# Patient Record
Sex: Female | Born: 1975 | Race: White | Hispanic: No | Marital: Single | Smoking: Current every day smoker
Health system: Southern US, Community
[De-identification: ages and names within clinical notes are randomized; demographics above are authoritative.]

## PROBLEM LIST (undated history)

## (undated) DIAGNOSIS — D649 Anemia, unspecified: Secondary | ICD-10-CM

## (undated) DIAGNOSIS — M1990 Unspecified osteoarthritis, unspecified site: Secondary | ICD-10-CM

## (undated) DIAGNOSIS — J45909 Unspecified asthma, uncomplicated: Secondary | ICD-10-CM

## (undated) DIAGNOSIS — F419 Anxiety disorder, unspecified: Secondary | ICD-10-CM

## (undated) DIAGNOSIS — Z9889 Other specified postprocedural states: Secondary | ICD-10-CM

## (undated) DIAGNOSIS — T7840XA Allergy, unspecified, initial encounter: Secondary | ICD-10-CM

## (undated) DIAGNOSIS — J189 Pneumonia, unspecified organism: Secondary | ICD-10-CM

## (undated) DIAGNOSIS — K219 Gastro-esophageal reflux disease without esophagitis: Secondary | ICD-10-CM

## (undated) DIAGNOSIS — R112 Nausea with vomiting, unspecified: Secondary | ICD-10-CM

## (undated) HISTORY — DX: Unspecified asthma, uncomplicated: J45.909

## (undated) HISTORY — DX: Allergy, unspecified, initial encounter: T78.40XA

## (undated) HISTORY — PX: FINGER SURGERY: SHX640

## (undated) HISTORY — DX: Unspecified osteoarthritis, unspecified site: M19.90

## (undated) HISTORY — DX: Anxiety disorder, unspecified: F41.9

## (undated) HISTORY — PX: WISDOM TOOTH EXTRACTION: SHX21

## (undated) HISTORY — PX: BREAST SURGERY: SHX581

---

## 1995-11-25 DIAGNOSIS — J189 Pneumonia, unspecified organism: Secondary | ICD-10-CM

## 1995-11-25 HISTORY — DX: Pneumonia, unspecified organism: J18.9

## 2000-05-19 ENCOUNTER — Emergency Department (HOSPITAL_COMMUNITY): Admission: EM | Admit: 2000-05-19 | Discharge: 2000-05-19 | Payer: Self-pay

## 2011-11-20 ENCOUNTER — Ambulatory Visit (INDEPENDENT_AMBULATORY_CARE_PROVIDER_SITE_OTHER): Payer: PRIVATE HEALTH INSURANCE

## 2011-11-20 DIAGNOSIS — R05 Cough: Secondary | ICD-10-CM

## 2011-11-20 DIAGNOSIS — F17200 Nicotine dependence, unspecified, uncomplicated: Secondary | ICD-10-CM

## 2012-04-27 ENCOUNTER — Telehealth: Payer: Self-pay

## 2012-04-27 NOTE — Telephone Encounter (Signed)
Please pull paper chart and forward phone not to primary provider.

## 2012-04-27 NOTE — Telephone Encounter (Signed)
Chart is your box per ryan's request.

## 2012-04-27 NOTE — Telephone Encounter (Signed)
Pt is calling to see if we could possible get her a letter to say that the e-cigrette is necessary for her if she gets that she says that her hsa account will pay it only with that letter. She tried taking the chantix we prescribed her but she said it made her feel psycotic please call her and let her know if we can do this

## 2012-05-03 ENCOUNTER — Telehealth: Payer: Self-pay

## 2012-05-03 NOTE — Telephone Encounter (Signed)
Needs letter for  Insurance purposes to support getting e - cigarettes.  Best [phone])

## 2012-05-05 NOTE — Telephone Encounter (Signed)
Explained to pt the need for OV for documentation. Pt verbalized understanding, but said she would just pay OOP for the e -cigs instead of having to pay for the OV.

## 2012-05-05 NOTE — Telephone Encounter (Signed)
Needs office visit so we can document what all she has tried, side effects, etc.  This info will be needed to document the need for the e-cigs

## 2012-07-06 ENCOUNTER — Telehealth: Payer: Self-pay

## 2012-07-06 NOTE — Telephone Encounter (Signed)
Pt would like to have her entire medical record copied. Please call when ready for pick up. Best# [phone]

## 2012-07-07 NOTE — Telephone Encounter (Signed)
Records copied and ready for pickup. Patient notified.

## 2012-07-17 ENCOUNTER — Telehealth: Payer: Self-pay

## 2012-07-17 NOTE — Telephone Encounter (Signed)
Pt is requesting we look in her chart and let her know what medications she is allergic to,.  Chart [id number]  cbn [phone]

## 2012-07-19 NOTE — Telephone Encounter (Signed)
LMOM advising pt of the two medications that we have listed in her chart as allergies and included both names of each. Asked for CB if pt has further ?s

## 2012-07-19 NOTE — Telephone Encounter (Signed)
Zyban (bupropion) and Lamisil (terbinafine)

## 2012-07-24 NOTE — Telephone Encounter (Signed)
Paper chart pulled again.  Last ov in December 2012.  At that ov - she had stated that e-cigarette caused sore throat.  Called patient - left message on voice mail that we can write a letter if still needed for e-cigarette, if she can tolerate this,  as was not able to tolerate chantix.

## 2012-07-25 NOTE — Telephone Encounter (Signed)
This was already discussed via separate phone encounter, and patient was advised to RTC to discuss this.  Do we still need to call?

## 2012-07-26 NOTE — Telephone Encounter (Signed)
If patient has already been advised once to RTC for this then we do not need to call again.

## 2013-04-05 ENCOUNTER — Telehealth: Payer: Self-pay | Admitting: Radiology

## 2013-04-05 ENCOUNTER — Other Ambulatory Visit: Payer: Self-pay | Admitting: Radiology

## 2013-04-05 ENCOUNTER — Telehealth: Payer: Self-pay

## 2013-04-05 ENCOUNTER — Ambulatory Visit (INDEPENDENT_AMBULATORY_CARE_PROVIDER_SITE_OTHER): Payer: No Typology Code available for payment source | Admitting: Family Medicine

## 2013-04-05 VITALS — BP 132/86 | HR 74 | Temp 98.6°F | Resp 16 | Ht 66.5 in | Wt 304.0 lb

## 2013-04-05 DIAGNOSIS — T148 Other injury of unspecified body region: Secondary | ICD-10-CM

## 2013-04-05 DIAGNOSIS — J309 Allergic rhinitis, unspecified: Secondary | ICD-10-CM

## 2013-04-05 DIAGNOSIS — J302 Other seasonal allergic rhinitis: Secondary | ICD-10-CM

## 2013-04-05 DIAGNOSIS — L03116 Cellulitis of left lower limb: Secondary | ICD-10-CM

## 2013-04-05 DIAGNOSIS — W57XXXA Bitten or stung by nonvenomous insect and other nonvenomous arthropods, initial encounter: Secondary | ICD-10-CM

## 2013-04-05 DIAGNOSIS — L02419 Cutaneous abscess of limb, unspecified: Secondary | ICD-10-CM

## 2013-04-05 MED ORDER — DOXYCYCLINE HYCLATE 100 MG PO TABS: 100.0000 mg | ORAL_TABLET | Freq: Two times a day (BID) | ORAL | Status: DC

## 2013-04-05 MED ORDER — ALBUTEROL SULFATE HFA 108 (90 BASE) MCG/ACT IN AERS: 2.0000 | INHALATION_SPRAY | Freq: Four times a day (QID) | RESPIRATORY_TRACT | Status: DC | PRN

## 2013-04-05 NOTE — Progress Notes (Signed)
[facility]:  Office Visit  Chief Complaint:  Chief Complaint  Patient presents with  . Insect Bite    HPI: Kayla Walsh is a 37 y.o. female who complains of insect bite and skin infection  Associated with worsening pain which started on Saturday, 4 days ago. She was doing yard work when this happened. Has had some swelling, warmth and nonpurulent draiange. Denies fevers,  chills. Has tried soap and water, alcohol witout releif. She thinks she was bitten by  "little red bugs "   H/o asthma and Allergies. Uses  Steroid inhaler and also Albuterol inhaler, uses only during allergy seasons. She sees Dr. Madie Reno.   Past Medical History  Diagnosis Date  . Allergy   . Arthritis   . Asthma   . Anxiety    History reviewed. No pertinent past surgical history. History   Social History  . Marital Status: Married    Spouse Name: N/A    Number of Children: N/A  . Years of Education: N/A   Social History Main Topics  . Smoking status: Current Every Day Smoker  . Smokeless tobacco: None  . Alcohol Use: No  . Drug Use: None  . Sexually Active: None   Other Topics Concern  . None   Social History Narrative  . None   Family History  Problem Relation Age of Onset  . Cancer Mother   . Hyperlipidemia Mother   . Hypertension Mother   . Hypertension Father   . Hyperlipidemia Father    Allergies  Allergen Reactions  . Zyban (Bupropion) Hives    hyperactivity  . Lamisil (Terbinafine Hcl) Hives, Itching and Rash   Prior to Admission medications   Not on File     ROS: The patient denies fevers, chills, night sweats, unintentional weight loss, chest pain, palpitations, wheezing, dyspnea on exertion, nausea, vomiting, abdominal pain, dysuria, hematuria, melena, numbness, weakness, or tingling.   All other systems have been reviewed and were otherwise negative with the exception of those mentioned in the HPI and as above.    PHYSICAL EXAM: Filed Vitals:   04/05/13 1040  BP: 132/86  Pulse: 74  Temp: 98.6 F (37 C)  Resp: 16   Filed Vitals:   04/05/13 1040  Height: 5' 6.5" (1.689 m)  Weight: 304 lb (137.893 kg)   Body mass index is 48.34 kg/(m^2).  General: Alert, no acute distress HEENT:  Normocephalic, atraumatic, oropharynx patent. EOMI, No exudates, Tm nl Cardiovascular:  Regular rate and rhythm, no rubs murmurs or gallops.  No Carotid bruits, radial pulse intact. No pedal edema.  Respiratory: Clear to auscultation bilaterally.  No wheezes, rales, or rhonchi.  No cyanosis, no use of accessory musculature GI: No organomegaly, abdomen is soft and non-tender, positive bowel sounds.  No masses. Skin: + cellulitis on left lower extremity. + warmth, + erythema Neurologic: Facial musculature symmetric. Psychiatric: Patient is appropriate throughout our interaction. Lymphatic: No cervical lymphadenopathy Musculoskeletal: Gait intact.   LABS: No results found for this or any previous visit.   EKG/XRAY:   Primary read interpreted by Dr. Conley Rolls at [facility].   ASSESSMENT/PLAN: Encounter Diagnoses  Name Primary?  . Cellulitis of leg, left Yes  . Insect bite   . Seasonal allergies    Marked area with pen Monitor for s/sx of worsening cellulitis Rx Doxycyline 100 mg BID x 10 days, soap and water wound care Rx Albuterol INH for allergies/asthma F/u prn     Glorine Hanratty PHUONG, DO 04/05/2013  11:05 AM

## 2013-04-05 NOTE — Patient Instructions (Addendum)
Cellulitis Cellulitis is an infection of the skin and the tissue beneath it. The infected area is usually red and tender. Cellulitis occurs most often in the arms and lower legs.   CAUSES   Cellulitis is caused by bacteria that enter the skin through cracks or cuts in the skin. The most common types of bacteria that cause cellulitis are Staphylococcus and Streptococcus. SYMPTOMS    Redness and warmth.   Swelling.   Tenderness or pain.   Fever.  DIAGNOSIS  Your caregiver can usually determine what is wrong based on a physical exam. Blood tests may also be done. TREATMENT   Treatment usually involves taking an antibiotic medicine. HOME CARE INSTRUCTIONS    Take your antibiotics as directed. Finish them even if you start to feel better.   Keep the infected arm or leg elevated to reduce swelling.   Apply a warm cloth to the affected area up to 4 times per day to relieve pain.   Only take over-the-counter or prescription medicines for pain, discomfort, or fever as directed by your caregiver.   Keep all follow-up appointments as directed by your caregiver.  SEEK MEDICAL CARE IF:    You notice red streaks coming from the infected area.   Your red area gets larger or turns dark in color.   Your bone or joint underneath the infected area becomes painful after the skin has healed.   Your infection returns in the same area or another area.   You notice a swollen bump in the infected area.   You develop new symptoms.  SEEK IMMEDIATE MEDICAL CARE IF:    You have a fever.   You feel very sleepy.   You develop vomiting or diarrhea.   You have a general ill feeling (malaise) with muscle aches and pains.  MAKE SURE YOU:    Understand these instructions.   Will watch your condition.   Will get help right away if you are not doing well or get worse.  Document Released: 08/20/2005 Document Revised: 05/11/2012 Document Reviewed: 01/26/2012 [employer] Patient Information 2013  [employer].    

## 2013-04-05 NOTE — Telephone Encounter (Signed)
Resent Doxy, patient states the pharmacy did not get this one.

## 2013-04-05 NOTE — Telephone Encounter (Signed)
Patient was seen today for a bug bite and is calling to let Dr. Conley Rolls know that it was a baby wheel bug. [phone]

## 2013-04-05 NOTE — Telephone Encounter (Signed)
She states pharmacy still does not have this. Called in to pharmacy.

## 2013-08-07 ENCOUNTER — Ambulatory Visit (INDEPENDENT_AMBULATORY_CARE_PROVIDER_SITE_OTHER): Payer: No Typology Code available for payment source | Admitting: Internal Medicine

## 2013-08-07 VITALS — BP 138/72 | HR 92 | Temp 99.2°F | Resp 16 | Ht 66.0 in | Wt 305.0 lb

## 2013-08-07 DIAGNOSIS — J209 Acute bronchitis, unspecified: Secondary | ICD-10-CM

## 2013-08-07 DIAGNOSIS — F17200 Nicotine dependence, unspecified, uncomplicated: Secondary | ICD-10-CM

## 2013-08-07 DIAGNOSIS — J45909 Unspecified asthma, uncomplicated: Secondary | ICD-10-CM

## 2013-08-07 MED ORDER — AZITHROMYCIN 500 MG PO TABS: 500.0000 mg | ORAL_TABLET | Freq: Every day | ORAL | Status: DC

## 2013-08-07 MED ORDER — HYDROCODONE-ACETAMINOPHEN 7.5-325 MG/15ML PO SOLN: 5.0000 mL | Freq: Four times a day (QID) | ORAL | Status: DC | PRN

## 2013-08-07 NOTE — Progress Notes (Signed)
  Subjective:    Patient ID: Kayla Walsh, female    DOB: [birth year], 37 y.o.   MRN: [id number]  HPI Has cough, yellow green sputum, asthma, and smokes. Room mate has bad bronchitis. No sob, cp at the moment.Usually chest colds get bad.   Review of Systems Asthma/allergys    Objective:   Physical Exam  Constitutional: She is oriented to person, place, and time. She appears well-nourished. No distress.  HENT:  Right Ear: External ear normal.  Left Ear: External ear normal.  Nose: Mucosal edema, rhinorrhea and sinus tenderness present.  Mouth/Throat: Oropharynx is clear and moist.  Eyes: EOM are normal.  Neck: Neck supple.  Pulmonary/Chest: Effort normal. Not tachypneic. No respiratory distress. She has no decreased breath sounds. She has wheezes. She has rhonchi. She has no rales.  Musculoskeletal: Normal range of motion.  Neurological: She is alert and oriented to person, place, and time. No cranial nerve deficit. She exhibits normal muscle tone. Coordination normal.  Skin: No rash noted.  Psychiatric: She has a normal mood and affect. Her behavior is normal.          Assessment & Plan:  Asthmatic- bronchitis Zithromax 500mg Leandro Reasoner elixir

## 2013-08-07 NOTE — Patient Instructions (Addendum)
Chronic Asthmatic Bronchitis Chronic asthmatic bronchitis is often a complication of frequent asthma and/or bronchitis. After a long enough period of time, the continual airflow blockage is present in spite of treatment for asthma. The medications that used to treat asthma no longer work. The symptoms of chronic bronchitis may also be present. Bronchitis is an inflammation of the breathing tubules in the lungs. The combination of asthma, chronic bronchitis, and emphysema all affect the small breathing tubules (bronchial tree) in our lungs. It is a common condition. The problems from each are similar and overlap with each other so are sometimes hard to diagnose. When the asthma and bronchitis are combined, there is usually inflammation and infection. The small bronchial tubes produce more mucus. This blocks the airways and makes breathing harder. Usually this process is caused more by external irritants than infection. Smokers with chronic bronchitis are at a greater risk to develop asthmatic bronchitis. CAUSES   Why some people with asthma go on to develop chronic asthmatic bronchitis is not known. Smoking and environmental toxins or allergens seem to play a role. There are wide differences in who is susceptible.  Abnormalities of the small airways may develop in persons with persistent asthma. Asthmatics can be uncommonly subject to the effects of smoking. Asthma is also found associated with a number of other diseases. SYMPTOMS  Asthma, chronic bronchitis, and emphysema all cause symptoms of cough, wheezing, shortness of breath, and recurring infections. There may also be chest discomfort. All of the above symptoms happen more often in chronic asthmatic bronchitis. DIAGNOSIS   Asthma, chronic bronchitis, and emphysema all affect the entire bronchial tree. This makes it difficult on exam to tell them apart. Other tests of the lungs are done to prove a diagnosis. These are called pulmonary function  tests. TREATMENT   The asthmatic condition itself must always be treated.  Infection can be treated with antibiotics (medications to kill germs).  Serious infections may require hospitalization. These can include pneumonia, sinus infections, and acute bronchitis. HOME CARE INSTRUCTIONS  Use prescription medications as ordered by your caregiver.  Avoid pollen, dust, animal dander, molds, smoke, and other things that cause attacks at home and at work.  You may have fewer attacks if you decrease dust in your home. Electrostatic air cleaners may help.  It may help to replace your pillows or mattress with materials less likely to cause allergies.  If you are not on fluid restriction, drink 8 to 10 glasses of water each day.  Discuss possible exercise routines with your caregiver.  If animal dander is the cause of asthma, you may need to get rid of pets.  It is important that you:  Become educated about your medical condition.  Participate in maintaining wellness.  Seek medical care promptly or immediately as indicated below.  Delay in seeking medical attention could cause permanent injury and may be a risk to your life. SEEK MEDICAL CARE IF  You have wheezing and shortness of breath even if taking medicine to prevent attacks.  An oral temperature above 102 F (38.9 C)  You have muscle aches, chest pain, or thickening of sputum.  Your sputum changes from clear or white to yellow, green, gray, or bloody.  You have any problems that may be related to the medicine you are taking (such as a rash, itching, swelling, or trouble breathing). SEEK IMMEDIATE MEDICAL CARE IF:  Your usual medicines do not stop your wheezing.  There is increased coughing and/or shortness of breath.  You  have increased difficulty breathing. MAKE SURE YOU:   Understand these instructions.  Will watch your condition.  Will get help right away if you are not doing well or get worse. Document  Released: 08/28/2006 Document Revised: 02/02/2012 Document Reviewed: 10/26/2007 [facility] Patient Information 2014 [place], [place].

## 2013-12-03 ENCOUNTER — Ambulatory Visit: Payer: No Typology Code available for payment source

## 2013-12-03 ENCOUNTER — Ambulatory Visit (INDEPENDENT_AMBULATORY_CARE_PROVIDER_SITE_OTHER): Payer: No Typology Code available for payment source | Admitting: Family Medicine

## 2013-12-03 VITALS — BP 140/88 | HR 83 | Temp 98.3°F | Resp 18 | Ht 66.0 in | Wt 292.0 lb

## 2013-12-03 DIAGNOSIS — M25561 Pain in right knee: Secondary | ICD-10-CM

## 2013-12-03 DIAGNOSIS — M25569 Pain in unspecified knee: Secondary | ICD-10-CM

## 2013-12-03 MED ORDER — HYDROCODONE-ACETAMINOPHEN 5-325 MG PO TABS: 1.0000 | ORAL_TABLET | Freq: Four times a day (QID) | ORAL | Status: DC | PRN

## 2013-12-03 NOTE — Patient Instructions (Addendum)
Crutches with weight bear on leg as tolerated. If any fracture noted on xray, we will call you. Elevate leg as able, try to decrease time on your leg, and other treatments below. Ice and heat as discussed, ice to start. Ibuprofen up to 800mg  every 8 hours as needed with food, but if more severe pain - prescription pain medicine if needed. Recheck in next 4-5 days to discuss next step. Return to the clinic or go to the nearest emergency room if any of your symptoms worsen or new symptoms occur. RICE: Routine Care for Injuries The routine care of many injuries includes Rest, Ice, Compression, and Elevation (RICE). HOME CARE INSTRUCTIONS  Rest is needed to allow your body to heal. Routine activities can usually be resumed when comfortable. Injured tendons and bones can take up to 6 weeks to heal. Tendons are the cord-like structures that attach muscle to bone.  Ice following an injury helps keep the swelling down and reduces pain.  Put ice in a plastic bag.  Place a towel between your skin and the bag.  Leave the ice on for 15-20 minutes, 03-04 times a day. Do this while awake, for the first 24 to 48 hours. After that, continue as directed by your caregiver.  Compression helps keep swelling down. It also gives support and helps with discomfort. If an elastic bandage has been applied, it should be removed and reapplied every 3 to 4 hours. It should not be applied tightly, but firmly enough to keep swelling down. Watch fingers or toes for swelling, bluish discoloration, coldness, numbness, or excessive pain. If any of these problems occur, remove the bandage and reapply loosely. Contact your caregiver if these problems continue.  Elevation helps reduce swelling and decreases pain. With extremities, such as the arms, hands, legs, and feet, the injured area should be placed near or above the level of the heart, if possible. SEEK IMMEDIATE MEDICAL CARE IF:  You have persistent pain and swelling.  You  develop redness, numbness, or unexpected weakness.  Your symptoms are getting worse rather than improving after several days. These symptoms may indicate that further evaluation or further X-rays are needed. Sometimes, X-rays may not show a small broken bone (fracture) until 1 week or 10 days later. Make a follow-up appointment with your caregiver. Ask when your X-ray results will be ready. Make sure you get your X-ray results. Document Released: 02/22/2001 Document Revised: 02/02/2012 Document Reviewed: 04/11/2011 [facility] Patient Information 2014 [place], [place].

## 2013-12-03 NOTE — Progress Notes (Signed)
Subjective:    Patient ID: Kayla Walsh, female    DOB: 04/22/1976, 38 y.o.   MRN: [id number]  HPI Kayla Walsh is a 38 y.o. female  R knee pain - past 3 days. Bad knees since high school, stopped much activity since HS.  Has had a few falls/injuries past few years, short term pain able to treat on own - has not had surgery, or need to see ortho. Was referred to ortho when younger, for possible surgery, but did not go through with this. Had 2 cortisone shots in high school.   Aching started 3 days ago, NKI initially, but slid when stepping on twig few inches. More pain since then - 6/10 pain, and occasional shooting pain. Feels more sore at end of work day today. To point of tears. Stiff at times, decreased ROM, unable to straighten past certain area. Hurts to lift at knee, feels like going to give way.   Tx: alleve every 12 hours past 3 days., tiger balm, aspercreme, heat. Elevation, heat.   Would have to be life or death for surgery, really does not want to have surgery.   There are no active problems to display for this patient.  Past Medical History  Diagnosis Date  . Allergy   . Arthritis   . Asthma   . Anxiety    Past Surgical History  Procedure Laterality Date  . Thumb surgery     Allergies  Allergen Reactions  . Zyban [Bupropion] Hives    hyperactivity  . Lamisil [Terbinafine Hcl] Hives, Itching and Rash   Prior to Admission medications   Medication Sig Start Date End Date Taking? Authorizing Provider  albuterol (PROVENTIL HFA;VENTOLIN HFA) 108 (90 BASE) MCG/ACT inhaler Inhale 2 puffs into the lungs every 6 (six) hours as needed for wheezing. 04/05/13  Yes Thao P Le, DO  azithromycin (ZITHROMAX) 500 MG tablet Take 1 tablet (500 mg total) by mouth daily. 08/07/13   Jonita Albeehris W Guest, MD  HYDROcodone-acetaminophen (HYCET) 7.5-325 mg/15 ml solution Take 5 mLs by mouth every 6 (six) hours as needed for pain (or cough). 08/07/13   Jonita Albeehris W Guest, MD   History   Social  History  . Marital Status: Married    Spouse Name: N/A    Number of Children: N/A  . Years of Education: N/A   Occupational History  . Not on file.   Social History Main Topics  . Smoking status: Current Every Day Smoker  . Smokeless tobacco: Not on file  . Alcohol Use: No  . Drug Use: Not on file  . Sexual Activity: Not on file   Other Topics Concern  . Not on file   Social History Narrative  . No narrative on file     Review of Systems  Musculoskeletal: Positive for arthralgias and joint swelling (feels like knee warm internally. ).  Skin: Negative for rash and wound.       Objective:   Physical Exam  Vitals reviewed. Constitutional: She is oriented to person, place, and time. She appears well-developed and well-nourished. No distress.  Overweight.   Pulmonary/Chest: Effort normal.  Musculoskeletal:       Right knee: She exhibits decreased range of motion (decreased extension - lacks approx 10 degrees, flex to 80. ), effusion (possibel with warmth to suprapatellar area, but larger knee. ) and bony tenderness. She exhibits no LCL laxity (no laxiity, but pain into medial jt line with LCL testing. guarded exam. ) and no MCL laxity. Tenderness (  guarded exam, diffuse pain medial knee and joint line but somewhat difficult exam with body habitus. ) found. Medial joint line tenderness noted. No MCL and no LCL tenderness noted.  Neurological: She is alert and oriented to person, place, and time.  Skin: Skin is warm and dry.  Psychiatric: She has a normal mood and affect. Her behavior is normal.   [facility] reading (PRIMARY) by  Dr. 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       Assessment & Plan:   Kayla Walsh is a 38 y.o. female Right knee pain - Plan: DG Knee Complete 4 Views Right, HYDROcodone-acetaminophen (NORCO/VICODIN) 5-325 MG per tablet   R knee pain - possible prior injury  when younger,  and intermittent pain that resolves on own with few minor injuries, but now with acute medial pain, suspicious for meniscus tear, but difficult/guarded exam. Options discussed including ortho eval, but will start with crutches, ace wrap, WBAT, PRICE as below, ibuprofen up to 800mg  every 8 hours as needed with food, and recheck in next 4-5 days for possible injection vs MRI vs ortho eval. Rtc precautions sooner if needed.    Meds ordered this encounter  Medications  . HYDROcodone-acetaminophen (NORCO/VICODIN) 5-325 MG per tablet    Sig: Take 1 tablet by mouth every 6 (six) hours as needed for moderate pain.    Dispense:  15 tablet    Refill:  0   Patient Instructions  Crutches with weight bear on leg as tolerated. If any fracture noted on xray, we will call you. Elevate leg as able, try to decrease time on your leg, and other treatments below. Ice and heat as discussed, ice to start. Ibuprofen up to 800mg  every 8 hours as needed with food, but if more severe pain - prescription pain medicine if needed. Recheck in next 4-5 days to discuss next step. Return to the clinic or go to the nearest emergency room if any of your symptoms worsen or new symptoms occur. RICE: Routine Care for Injuries The routine care of many injuries includes Rest, Ice, Compression, and Elevation (RICE). HOME CARE INSTRUCTIONS  Rest is needed to allow your body to heal. Routine activities can usually be resumed when comfortable. Injured tendons and bones can take up to 6 weeks to heal. Tendons are the cord-like structures that attach muscle to bone.  Ice following an injury helps keep the swelling down and reduces pain.  Put ice in a plastic bag.  Place a towel between your skin and the bag.  Leave the ice on for 15-20 minutes, 03-04 times a day. Do this while awake, for the first 24 to 48 hours. After that, continue as directed by your caregiver.  Compression helps keep swelling down. It also gives  support and helps with discomfort. If an elastic bandage has been applied, it should be removed and reapplied every 3 to 4 hours. It should not be applied tightly, but firmly enough to keep swelling down. Watch fingers or toes for swelling, bluish discoloration, coldness, numbness, or excessive pain. If any of these problems occur, remove the bandage and reapply loosely. Contact your caregiver if these problems continue.  Elevation helps reduce swelling and decreases pain. With extremities, such as the arms, hands, legs, and feet, the injured area should be placed near or above the level of the heart, if possible. SEEK IMMEDIATE MEDICAL CARE IF:  You have persistent pain and swelling.  You develop redness, numbness, or unexpected weakness.  Your symptoms are getting worse rather than improving after several days. These symptoms may indicate that further evaluation or further X-rays are needed. Sometimes, X-rays may not show a small broken bone (fracture) until 1 week or 10 days later. Make a follow-up appointment with your caregiver. Ask when your X-ray results will be ready. Make sure you get your X-ray results. Document Released: 02/22/2001 Document Revised: 02/02/2012 Document Reviewed: 04/11/2011 [facility] Patient Information 2014 [place], [place].

## 2013-12-07 ENCOUNTER — Ambulatory Visit (INDEPENDENT_AMBULATORY_CARE_PROVIDER_SITE_OTHER): Payer: No Typology Code available for payment source | Admitting: Family Medicine

## 2013-12-07 VITALS — BP 124/76 | HR 98 | Temp 98.8°F | Resp 17 | Ht 66.0 in | Wt 290.0 lb

## 2013-12-07 DIAGNOSIS — M25561 Pain in right knee: Secondary | ICD-10-CM

## 2013-12-07 DIAGNOSIS — M25569 Pain in unspecified knee: Secondary | ICD-10-CM

## 2013-12-07 NOTE — Patient Instructions (Signed)
Continue ibuprofen up to 800mg  every 8 hours as needed with food. Crutch if needed. There may be a small meniscus tear, but this would be diagnosed with an MRI. Can start exercises as discussed and if not continuing to improve in next few weeks, or any worsening sooner - let me know and we can refer you to orthopaedics.  Return to the clinic or go to the nearest emergency room if any of your symptoms worsen or new symptoms occur.  Meniscus Tear with Phase I Rehab The meniscus is a C-shaped cartilage structure, located in the knee joint between the thigh bone (femur) and the shinbone (tibia). Two menisci are located in each knee joint: the inner and outer meniscus. The meniscus acts as an adapter between the thigh bone and shinbone, allowing them to fit properly together. It also functions as a shock absorber, to reduce the stress placed on the knee joint and to help supply nutrients to the knee joint cartilage. As people age, the meniscus begins to harden and become more vulnerable to injury. Meniscus tears are a common injury, especially in older athletes. Inner meniscus tears are more common than outer meniscus tears.  SYMPTOMS   Pain in the knee, especially with standing or squatting with the affected leg.  Tenderness along the joint line.  Swelling in the knee joint (effusion), usually starting 1 to 2 days after injury.  Locking or catching of the knee joint, causing inability to straighten the knee completely.  Giving way or buckling of the knee. CAUSES  A meniscus tear occurs when a force is placed on the meniscus that is greater than it can handle. Common causes of injury include:  Direct hit (trauma) to the knee.  Twisting, pivoting, or cutting (rapidly changing direction while running), kneeling or squatting.  Without injury, due to aging. RISK INCREASES WITH:  Contact sports (football, rugby).  Sports in which cleats are used with pivoting (soccer, lacrosse) or sports in which  good shoe grip and sudden change in direction are required (racquetball, basketball, squash).  Previous knee injury.  Associated knee injury, particularly ligament injuries.  Poor strength and flexibility. PREVENTION  Warm up and stretch properly before activity.  Maintain physical fitness:  Strength, flexibility, and endurance.  Cardiovascular fitness.  Protect the knee with a brace or elastic bandage.  Wear properly fitted protective equipment (proper cleats for the surface). PROGNOSIS  Sometimes, meniscus tears heal on their own. However, definitive treatment requires surgery, followed by at least 6 weeks of recovery.  RELATED COMPLICATIONS   Recurring symptoms that result in a chronic problem.  Repeated knee injury, especially if sports are resumed too soon after injury or surgery.  Progression of the tear (the tear gets larger), if untreated.  Arthritis of the knee in later years (with or without surgery).  Complications of surgery, including infection, bleeding, injury to nerves (numbness, weakness, paralysis) continued pain, giving way, locking, nonhealing of meniscus (if repaired), need for further surgery, and knee stiffness (loss of motion). TREATMENT  Treatment first involves the use of ice and medicine, to reduce pain and inflammation. You may find using crutches to walk more comfortable. However, it is okay to bear weight on the injured knee, if the pain will allow it. Surgery is often advised as a definitive treatment. Surgery is performed through an incision near the joint (arthroscopically). The torn piece of the meniscus is removed, and if possible the joint cartilage is repaired. After surgery, the joint must be restrained. After restraint,  it is important to perform strengthening and stretching exercises to help regain strength and a full range of motion. These exercises may be completed at home or with a therapist.  MEDICATION  If pain medicine is needed,  nonsteroidal anti-inflammatory medicines (aspirin and ibuprofen), or other minor pain relievers (acetaminophen), are often advised.  Do not take pain medicine for 7 days before surgery.  Prescription pain relievers may be given, if your caregiver thinks they are needed. Use only as directed and only as much as you need. HEAT AND COLD  Cold treatment (icing) should be applied for 10 to 15 minutes every 2 to 3 hours for inflammation and pain, and immediately after activity that aggravates your symptoms. Use ice packs or an ice massage.  Heat treatment may be used before performing stretching and strengthening activities prescribed by your caregiver, physical therapist, or athletic trainer. Use a heat pack or a warm water soak. SEEK MEDICAL CARE IF:   Symptoms get worse or do not improve in 2 weeks, despite treatment.  New, unexplained symptoms develop. (Drugs used in treatment may produce side effects.) EXERCISES RANGE OF MOTION (ROM) AND STRETCHING EXERCISES - Meniscus Tear, Non-operative, Phase I These are some of the initial exercises with which you may start your rehabilitation program, until you see your caregiver again or until your symptoms are resolved. Remember:   These initial exercises are intended to be gentle. They will help you restore motion without increasing any swelling.  Completing these exercises allows less painful movement and prepares you for the more aggressive strengthening exercises in Phase II.  An effective stretch should be held for at least 30 seconds.  A stretch should never be painful. You should only feel a gentle lengthening or release in the stretched tissue. RANGE OF MOTION - Knee Flexion, Active  Lie on your back with both knees straight. (If this causes back discomfort, bend your healthy knee, placing your foot flat on the floor.)  Slowly slide your heel back toward your buttocks until you feel a gentle stretch in the front of your knee or  thigh.  Hold for __________ seconds. Slowly slide your heel back to the starting position. Repeat __________ times. Complete this exercise __________ times per day.  RANGE OF MOTION - Knee Flexion and Extension, Active-Assisted  Sit on the edge of a table or chair with your thighs firmly supported. It may be helpful to place a folded towel under the end of your right / left thigh.  Flexion (bending): Place the ankle of your healthy leg on top of the other ankle. Use your healthy leg to gently bend your right / left knee until you feel a mild tension across the top of your knee.  Hold for __________ seconds.  Extension (straightening): Switch your ankles so your right / left leg is on top. Use your healthy leg to straighten your right / left knee until you feel a mild tension on the backside of your knee.  Hold for __________ seconds. Repeat __________ times. Complete __________ times per day. STRETCH - Knee Flexion, Supine  Lie on the floor with your right / left heel and foot lightly touching the wall. (Place both feet on the wall if you do not use a door frame.)  Without using any effort, allow gravity to slide your foot down the wall slowly until you feel a gentle stretch in the front of your right / left knee.  Hold this stretch for __________ seconds. Then return the leg  to the starting position, using your healthy leg for help, if needed. Repeat __________ times. Complete this stretch __________ times per day.  STRETCH - Knee Extension Sitting  Sit with your right / left leg/heel propped on another chair, coffee table, or foot stool.  Allow your leg muscles to relax, letting gravity straighten out your knee.*  You should feel a stretch behind your right / left knee. Hold this position for __________ seconds. Repeat __________ times. Complete this stretch __________ times per day.  *Your physician, physical therapist or athletic trainer may instruct you place a __________ weight  on your thigh, just above your kneecap, to deepen the stretch.  STRENGTHENING EXERCISES - Meniscus Tear, Non-operative, Phase I These exercises may help you when beginning to rehabilitate your injury. They may resolve your symptoms with or without further involvement from your physician, physical therapist or athletic trainer. While completing these exercises, remember:   Muscles can gain both the endurance and the strength needed for everyday activities through controlled exercises.  Complete these exercises as instructed by your physician, physical therapist or athletic trainer. Progress the resistance and repetitions only as guided. STRENGTH - Quadriceps, Isometrics  Lie on your back with your right / left leg extended and your opposite knee bent.  Gradually tense the muscles in the front of your right / left thigh. You should see either your knee cap slide up toward your hip or increased dimpling just above the knee. This motion will push the back of the knee down toward the floor, mat, or bed on which you are lying.  Hold the muscle as tight as you can, without increasing your pain, for __________ seconds.  Relax the muscles slowly and completely between each repetition. Repeat __________ times. Complete this exercise __________ times per day.  STRENGTH - Quadriceps, Short Arcs   Lie on your back. Place a __________ inch towel roll under your right / left knee, so that the knee bends slightly.  Raise only your lower leg by tightening the muscles in the front of your thigh. Do not allow your thigh to rise.  Hold this position for __________ seconds. Repeat __________ times. Complete this exercise __________ times per day.  OPTIONAL ANKLE WEIGHTS: Begin with ____________________, but DO NOT exceed ____________________. Increase in 1 pound/0.5 kilogram increments. STRENGTH - Quadriceps, Straight Leg Raises  Quality counts! Watch for signs that the quadriceps muscle is working, to be sure  you are strengthening the correct muscles and not "cheating" by substituting with healthier muscles.  Lay on your back with your right / left leg extended and your opposite knee bent.  Tense the muscles in the front of your right / left thigh. You should see either your knee cap slide up or increased dimpling just above the knee. Your thigh may even shake a bit.  Tighten these muscles even more and raise your leg 4 to 6 inches off the floor. Hold for __________ seconds.  Keeping these muscles tense, lower your leg.  Relax the muscles slowly and completely in between each repetition. Repeat __________ times. Complete this exercise __________ times per day.  STRENGTH - Hamstring, Curls   Lay on your stomach with your legs extended. (If you lay on a bed, your feet may hang over the edge.)  Tighten the muscles in the back of your thigh to bend your right / left knee up to 90 degrees. Keep your hips flat on the bed.  Hold this position for __________ seconds.  Slowly lower  your leg back to the starting position. Repeat __________ times. Complete this exercise __________ times per day.  STRENGTH  Quadriceps, Squats  Stand in a door frame so that your feet and knees are in line with the frame.  Use your hands for balance, not support, on the frame.  Slowly lower your weight, bending at the hips and knees. Keep your lower legs upright so that they are parallel with the door frame. Squat only within the range that does not increase your knee pain. Never let your hips drop below your knees.  Slowly return upright, pushing with your legs, not pulling with your hands. Repeat __________ times. Complete this exercise __________ times per day.  STRENGTH - Quad/VMO, Isometric   Sit in a chair with your right / left knee slightly bent. With your fingertips, feel the VMO muscle just above the inside of your knee. The VMO is important in controlling the position of your kneecap.  Keeping your  fingertips on this muscle. Without actually moving your leg, attempt to drive your knee down as if straightening your leg. You should feel your VMO tense. If you have a difficult time, you may wish to try the same exercise on your healthy knee first.  Tense this muscle as hard as you can without increasing any knee pain.  Hold for __________ seconds. Relax the muscles slowly and completely in between each repetition. Repeat __________ times. Complete exercise __________ times per day.  Document Released: 11/24/1998 Document Revised: 02/02/2012 Document Reviewed: 02/22/2009 [facility] Patient Information 2014 [place], [place].

## 2013-12-07 NOTE — Progress Notes (Addendum)
Subjective:    Patient ID: Kayla Walsh, female    DOB: [birth year], 38 y.o.   MRN: [id number]  This chart was scribed for Shade Flood, MD by Blanchard Kelch, ED Scribe. The patient was seen in room 11. Patient's care was started at 5:02 PM.  Chief Complaint  Patient presents with  . Follow-up    knee injury     PCP: No PCP Per Patient   HPI  Kayla Walsh is a 38 y.o. female who presents to office for a follow up. Seen four days ago with right knee pain for approximately three days. Suspicious for medial meniscus injury but difficult exam with slight guarding. Crutches, ace wrap and ibuprofen 800 mg prescribed. Xray report indicated degenerative change without apparent acute abnormality but did have "well corticated bony density noted along the medial aspect of the  tibial plateau. This is not felt to represent an acute fracture but may be related to prior trauma." per radiology reading (Dr. Karle Starch).  Today, she states that the right knee pain is much improved. She has been using the crutches given sporadically. She has been carrying them with her just in case and was using them at work three days ago. She describes the current pain as a mild aching that appears when she straightens her leg and "catches" if she kicks her leg out too quickly. She denies feeling it give way or any weakness. She has been able to flex and extend the knee. She states that she has been taking OTC ibuprofen, approximately 600 mg every six hours with moderate relief. She has also taken a total of three hydrocodone for the pain since being seen four days ago.  No locking.    There are no active problems to display for this patient.  Past Medical History  Diagnosis Date  . Allergy   . Arthritis   . Asthma   . Anxiety    Past Surgical History  Procedure Laterality Date  . Thumb surgery     Allergies  Allergen Reactions  . Zyban [Bupropion] Hives    hyperactivity  . Lamisil [Terbinafine Hcl] Hives,  Itching and Rash   Prior to Admission medications   Medication Sig Start Date End Date Taking? Authorizing Provider  albuterol (PROVENTIL HFA;VENTOLIN HFA) 108 (90 BASE) MCG/ACT inhaler Inhale 2 puffs into the lungs every 6 (six) hours as needed for wheezing. 04/05/13  Yes Thao P Le, DO  HYDROcodone-acetaminophen (NORCO/VICODIN) 5-325 MG per tablet Take 1 tablet by mouth every 6 (six) hours as needed for moderate pain. 12/03/13  Yes Shade Flood, MD   History   Social History  . Marital Status: Married    Spouse Name: N/A    Number of Children: N/A  . Years of Education: N/A   Occupational History  . Not on file.   Social History Main Topics  . Smoking status: Current Every Day Smoker    Types: Cigarettes  . Smokeless tobacco: Not on file  . Alcohol Use: No  . Drug Use: Not on file  . Sexual Activity: Not on file   Other Topics Concern  . Not on file   Social History Narrative  . No narrative on file     Review of Systems  Constitutional: Negative for fever.  HENT: Negative for drooling.   Eyes: Negative for discharge.  Respiratory: Negative for cough.   Cardiovascular: Negative for leg swelling.  Gastrointestinal: Negative for vomiting.  Endocrine: Negative for polyuria.  Genitourinary: Negative  for hematuria.  Musculoskeletal: Positive for arthralgias. Negative for joint swelling.  Skin: Negative for rash.  Allergic/Immunologic: Negative for immunocompromised state.  Neurological: Negative for speech difficulty.  Hematological: Negative for adenopathy.  Psychiatric/Behavioral: Negative for confusion.       Objective:   Physical Exam  Nursing note and vitals reviewed. Constitutional: She is oriented to person, place, and time. She appears well-developed and well-nourished. No distress.  HENT:  Head: Normocephalic and atraumatic.  Eyes: EOM are normal.  Neck: Neck supple. No tracheal deviation present.  Cardiovascular: Normal rate.   Pulmonary/Chest:  Effort normal. No respiratory distress.  Musculoskeletal: Normal range of motion.  Full flexion and extension of right knee. Collateral testing without discomfort. No apparent laxity. Negative anterior and posterior drawer. With McMurray testing, has slight medial pain with flexion of right knee and internal rotation of right foot.   Neurological: She is alert and oriented to person, place, and time.  Skin: Skin is warm and dry. No erythema.  Skin cool to touch without erythema.   Psychiatric: She has a normal mood and affect. Her behavior is normal.     Filed Vitals:   12/07/13 1623  BP: 124/76  Pulse: 98  Temp: 98.8 F (37.1 C)  TempSrc: Oral  Resp: 17  Height: 5\' 6"  (1.676 m)  Weight: 290 lb (131.543 kg)  SpO2: 97%        Assessment & Plan:  Kayla Walsh is a 38 y.o. female Right knee pain  Improving. Possible MMT, but as improved, will cont sx care , has crutches if needed, ibuprofen as below and hydrocodone if incr pain. Start HEP, and if any worsening, or not improving in next few weeks, can refer to ortho.   No orders of the defined types were placed in this encounter.   Patient Instructions  Continue ibuprofen up to 800mg  every 8 hours as needed with food. Crutch if needed. There may be a small meniscus tear, but this would be diagnosed with an MRI. Can start exercises as discussed and if not continuing to improve in next few weeks, or any worsening sooner - let me know and we can refer you to orthopaedics.  Return to the clinic or go to the nearest emergency room if any of your symptoms worsen or new symptoms occur.  Meniscus Tear with Phase I Rehab The meniscus is a C-shaped cartilage structure, located in the knee joint between the thigh bone (femur) and the shinbone (tibia). Two menisci are located in each knee joint: the inner and outer meniscus. The meniscus acts as an adapter between the thigh bone and shinbone, allowing them to fit properly together. It also  functions as a shock absorber, to reduce the stress placed on the knee joint and to help supply nutrients to the knee joint cartilage. As people age, the meniscus begins to harden and become more vulnerable to injury. Meniscus tears are a common injury, especially in older athletes. Inner meniscus tears are more common than outer meniscus tears.  SYMPTOMS   Pain in the knee, especially with standing or squatting with the affected leg.  Tenderness along the joint line.  Swelling in the knee joint (effusion), usually starting 1 to 2 days after injury.  Locking or catching of the knee joint, causing inability to straighten the knee completely.  Giving way or buckling of the knee. CAUSES  A meniscus tear occurs when a force is placed on the meniscus that is greater than it can handle. Common causes  of injury include:  Direct hit (trauma) to the knee.  Twisting, pivoting, or cutting (rapidly changing direction while running), kneeling or squatting.  Without injury, due to aging. RISK INCREASES WITH:  Contact sports (football, rugby).  Sports in which cleats are used with pivoting (soccer, lacrosse) or sports in which good shoe grip and sudden change in direction are required (racquetball, basketball, squash).  Previous knee injury.  Associated knee injury, particularly ligament injuries.  Poor strength and flexibility. PREVENTION  Warm up and stretch properly before activity.  Maintain physical fitness:  Strength, flexibility, and endurance.  Cardiovascular fitness.  Protect the knee with a brace or elastic bandage.  Wear properly fitted protective equipment (proper cleats for the surface). PROGNOSIS  Sometimes, meniscus tears heal on their own. However, definitive treatment requires surgery, followed by at least 6 weeks of recovery.  RELATED COMPLICATIONS   Recurring symptoms that result in a chronic problem.  Repeated knee injury, especially if sports are resumed too  soon after injury or surgery.  Progression of the tear (the tear gets larger), if untreated.  Arthritis of the knee in later years (with or without surgery).  Complications of surgery, including infection, bleeding, injury to nerves (numbness, weakness, paralysis) continued pain, giving way, locking, nonhealing of meniscus (if repaired), need for further surgery, and knee stiffness (loss of motion). TREATMENT  Treatment first involves the use of ice and medicine, to reduce pain and inflammation. You may find using crutches to walk more comfortable. However, it is okay to bear weight on the injured knee, if the pain will allow it. Surgery is often advised as a definitive treatment. Surgery is performed through an incision near the joint (arthroscopically). The torn piece of the meniscus is removed, and if possible the joint cartilage is repaired. After surgery, the joint must be restrained. After restraint, it is important to perform strengthening and stretching exercises to help regain strength and a full range of motion. These exercises may be completed at home or with a therapist.  MEDICATION  If pain medicine is needed, nonsteroidal anti-inflammatory medicines (aspirin and ibuprofen), or other minor pain relievers (acetaminophen), are often advised.  Do not take pain medicine for 7 days before surgery.  Prescription pain relievers may be given, if your caregiver thinks they are needed. Use only as directed and only as much as you need. HEAT AND COLD  Cold treatment (icing) should be applied for 10 to 15 minutes every 2 to 3 hours for inflammation and pain, and immediately after activity that aggravates your symptoms. Use ice packs or an ice massage.  Heat treatment may be used before performing stretching and strengthening activities prescribed by your caregiver, physical therapist, or athletic trainer. Use a heat pack or a warm water soak. SEEK MEDICAL CARE IF:   Symptoms get worse or do  not improve in 2 weeks, despite treatment.  New, unexplained symptoms develop. (Drugs used in treatment may produce side effects.) EXERCISES RANGE OF MOTION (ROM) AND STRETCHING EXERCISES - Meniscus Tear, Non-operative, Phase I These are some of the initial exercises with which you may start your rehabilitation program, until you see your caregiver again or until your symptoms are resolved. Remember:   These initial exercises are intended to be gentle. They will help you restore motion without increasing any swelling.  Completing these exercises allows less painful movement and prepares you for the more aggressive strengthening exercises in Phase II.  An effective stretch should be held for at least 30 seconds.  A stretch should never be painful. You should only feel a gentle lengthening or release in the stretched tissue. RANGE OF MOTION - Knee Flexion, Active  Lie on your back with both knees straight. (If this causes back discomfort, bend your healthy knee, placing your foot flat on the floor.)  Slowly slide your heel back toward your buttocks until you feel a gentle stretch in the front of your knee or thigh.  Hold for __________ seconds. Slowly slide your heel back to the starting position. Repeat __________ times. Complete this exercise __________ times per day.  RANGE OF MOTION - Knee Flexion and Extension, Active-Assisted  Sit on the edge of a table or chair with your thighs firmly supported. It may be helpful to place a folded towel under the end of your right / left thigh.  Flexion (bending): Place the ankle of your healthy leg on top of the other ankle. Use your healthy leg to gently bend your right / left knee until you feel a mild tension across the top of your knee.  Hold for __________ seconds.  Extension (straightening): Switch your ankles so your right / left leg is on top. Use your healthy leg to straighten your right / left knee until you feel a mild tension on the  backside of your knee.  Hold for __________ seconds. Repeat __________ times. Complete __________ times per day. STRETCH - Knee Flexion, Supine  Lie on the floor with your right / left heel and foot lightly touching the wall. (Place both feet on the wall if you do not use a door frame.)  Without using any effort, allow gravity to slide your foot down the wall slowly until you feel a gentle stretch in the front of your right / left knee.  Hold this stretch for __________ seconds. Then return the leg to the starting position, using your healthy leg for help, if needed. Repeat __________ times. Complete this stretch __________ times per day.  STRETCH - Knee Extension Sitting  Sit with your right / left leg/heel propped on another chair, coffee table, or foot stool.  Allow your leg muscles to relax, letting gravity straighten out your knee.*  You should feel a stretch behind your right / left knee. Hold this position for __________ seconds. Repeat __________ times. Complete this stretch __________ times per day.  *Your physician, physical therapist or athletic trainer may instruct you place a __________ weight on your thigh, just above your kneecap, to deepen the stretch.  STRENGTHENING EXERCISES - Meniscus Tear, Non-operative, Phase I These exercises may help you when beginning to rehabilitate your injury. They may resolve your symptoms with or without further involvement from your physician, physical therapist or athletic trainer. While completing these exercises, remember:   Muscles can gain both the endurance and the strength needed for everyday activities through controlled exercises.  Complete these exercises as instructed by your physician, physical therapist or athletic trainer. Progress the resistance and repetitions only as guided. STRENGTH - Quadriceps, Isometrics  Lie on your back with your right / left leg extended and your opposite knee bent.  Gradually tense the muscles in  the front of your right / left thigh. You should see either your knee cap slide up toward your hip or increased dimpling just above the knee. This motion will push the back of the knee down toward the floor, mat, or bed on which you are lying.  Hold the muscle as tight as you can, without increasing your pain, for  __________ seconds.  Relax the muscles slowly and completely between each repetition. Repeat __________ times. Complete this exercise __________ times per day.  STRENGTH - Quadriceps, Short Arcs   Lie on your back. Place a __________ inch towel roll under your right / left knee, so that the knee bends slightly.  Raise only your lower leg by tightening the muscles in the front of your thigh. Do not allow your thigh to rise.  Hold this position for __________ seconds. Repeat __________ times. Complete this exercise __________ times per day.  OPTIONAL ANKLE WEIGHTS: Begin with ____________________, but DO NOT exceed ____________________. Increase in 1 pound/0.5 kilogram increments. STRENGTH - Quadriceps, Straight Leg Raises  Quality counts! Watch for signs that the quadriceps muscle is working, to be sure you are strengthening the correct muscles and not "cheating" by substituting with healthier muscles.  Lay on your back with your right / left leg extended and your opposite knee bent.  Tense the muscles in the front of your right / left thigh. You should see either your knee cap slide up or increased dimpling just above the knee. Your thigh may even shake a bit.  Tighten these muscles even more and raise your leg 4 to 6 inches off the floor. Hold for __________ seconds.  Keeping these muscles tense, lower your leg.  Relax the muscles slowly and completely in between each repetition. Repeat __________ times. Complete this exercise __________ times per day.  STRENGTH - Hamstring, Curls   Lay on your stomach with your legs extended. (If you lay on a bed, your feet may hang over the  edge.)  Tighten the muscles in the back of your thigh to bend your right / left knee up to 90 degrees. Keep your hips flat on the bed.  Hold this position for __________ seconds.  Slowly lower your leg back to the starting position. Repeat __________ times. Complete this exercise __________ times per day.  STRENGTH  Quadriceps, Squats  Stand in a door frame so that your feet and knees are in line with the frame.  Use your hands for balance, not support, on the frame.  Slowly lower your weight, bending at the hips and knees. Keep your lower legs upright so that they are parallel with the door frame. Squat only within the range that does not increase your knee pain. Never let your hips drop below your knees.  Slowly return upright, pushing with your legs, not pulling with your hands. Repeat __________ times. Complete this exercise __________ times per day.  STRENGTH - Quad/VMO, Isometric   Sit in a chair with your right / left knee slightly bent. With your fingertips, feel the VMO muscle just above the inside of your knee. The VMO is important in controlling the position of your kneecap.  Keeping your fingertips on this muscle. Without actually moving your leg, attempt to drive your knee down as if straightening your leg. You should feel your VMO tense. If you have a difficult time, you may wish to try the same exercise on your healthy knee first.  Tense this muscle as hard as you can without increasing any knee pain.  Hold for __________ seconds. Relax the muscles slowly and completely in between each repetition. Repeat __________ times. Complete exercise __________ times per day.  Document Released: 11/24/1998 Document Revised: 02/02/2012 Document Reviewed: 02/22/2009 [facility] Patient Information 2014 [place], [place].     I personally performed the services described in this documentation, which was scribed in my presence. The  recorded information has been reviewed and considered,  and addended by me as needed.

## 2015-10-15 ENCOUNTER — Ambulatory Visit (INDEPENDENT_AMBULATORY_CARE_PROVIDER_SITE_OTHER): Payer: 59 | Admitting: Nurse Practitioner

## 2015-10-15 ENCOUNTER — Encounter: Payer: Self-pay | Admitting: Nurse Practitioner

## 2015-10-15 VITALS — BP 108/72 | HR 80 | Ht 66.25 in | Wt 192.0 lb

## 2015-10-15 DIAGNOSIS — Z01419 Encounter for gynecological examination (general) (routine) without abnormal findings: Secondary | ICD-10-CM | POA: Diagnosis not present

## 2015-10-15 DIAGNOSIS — Z23 Encounter for immunization: Secondary | ICD-10-CM | POA: Diagnosis not present

## 2015-10-15 DIAGNOSIS — Z113 Encounter for screening for infections with a predominantly sexual mode of transmission: Secondary | ICD-10-CM | POA: Diagnosis not present

## 2015-10-15 DIAGNOSIS — F411 Generalized anxiety disorder: Secondary | ICD-10-CM | POA: Diagnosis not present

## 2015-10-15 DIAGNOSIS — Z0000 Encounter for general adult medical examination without abnormal findings: Secondary | ICD-10-CM

## 2015-10-15 DIAGNOSIS — N926 Irregular menstruation, unspecified: Secondary | ICD-10-CM

## 2015-10-15 DIAGNOSIS — N920 Excessive and frequent menstruation with regular cycle: Secondary | ICD-10-CM | POA: Diagnosis not present

## 2015-10-15 DIAGNOSIS — E559 Vitamin D deficiency, unspecified: Secondary | ICD-10-CM | POA: Diagnosis not present

## 2015-10-15 LAB — POCT URINALYSIS DIPSTICK
BILIRUBIN UA: NEGATIVE
Blood, UA: NEGATIVE
Glucose, UA: NEGATIVE
Ketones, UA: NEGATIVE
LEUKOCYTES UA: NEGATIVE
NITRITE UA: NEGATIVE
PH UA: 6
PROTEIN UA: NEGATIVE
Urobilinogen, UA: NEGATIVE

## 2015-10-15 LAB — POCT URINE PREGNANCY: Preg Test, Ur: NEGATIVE

## 2015-10-15 MED ORDER — MEDROXYPROGESTERONE ACETATE 10 MG PO TABS: 10.0000 mg | ORAL_TABLET | Freq: Every day | ORAL | Status: DC

## 2015-10-15 NOTE — Patient Instructions (Signed)

## 2015-10-15 NOTE — Progress Notes (Addendum)
Patient ID: Kayla Walsh, female   DOB: 09/11/1976, 39 y.o.   MRN: [id number] 39 y.o. G0P0 Single mixed Asian & Caucasian Fe here for NGYN annual exam.  She has not had GYN visit since age 58 with an [employer] [place].   Menses has always been normal 28 day and flow for 3-5 days.  Past 3.5 months menses is irregular at occuring every 2 week.  This last one was 8 days long.  The blood flow very dark and thin watery.  Some increase cramps.  Changing pad and tampon every 2 hours X 2 days of 8 days.   She has not been SA X 11 months.  Same sex partner.  No history of STD's and no testing.  She also took Vit D for a very low level in April - now off med's and no recent recheck has been done. Weight loss in 12 months has been at 96 lbs.  In past 6 months was 40 lbs of that.  Patient's last menstrual period was 10/06/2015 (exact date).          Sexually active: Yes.   Not currently.  The current method of family planning is none. Same sex partner.  Exercising: Yes.    walking 3-4 times per week (walk/hike/jog/run).  Free weights at home.  Yoga-like exercise. Smoker:  no  Health Maintenance: Pap:  1995 TDaP:  ? Labs: 01/2015 at Urgent Care  Urine: neg UPT: neg   reports that she has been smoking Cigarettes.  She has a 18 pack-year smoking history. She has never used smokeless tobacco. She reports that she does not drink alcohol or use illicit drugs.  Past Medical History  Diagnosis Date  . Allergy   . Arthritis   . Asthma   . Anxiety     Past Surgical History  Procedure Laterality Date  . Finger surgery Right age 17    thumb    Current Outpatient Prescriptions  Medication Sig Dispense Refill  . albuterol (PROVENTIL HFA;VENTOLIN HFA) 108 (90 BASE) MCG/ACT inhaler Inhale 2 puffs into the lungs every 6 (six) hours as needed for wheezing. 2 Inhaler 5  . medroxyPROGESTERone (PROVERA) 10 MG tablet Take 1 tablet (10 mg total) by mouth daily. 10 tablet 0   No current  facility-administered medications for this visit.    Family History  Problem Relation Age of Onset  . Hyperlipidemia Mother   . Hypertension Mother   . Breast cancer Mother 29    chemo, radiation, mastectomy  . Hypertension Father   . Hyperlipidemia Father   . Hyperlipidemia Sister   . Hypertension Sister   . Heart failure Paternal Grandmother     70's  . Heart failure Paternal Grandfather     4  . Prostate cancer Paternal Grandfather   . Hyperlipidemia Sister   . Hypertension Sister   . Hypertension Sister   . Hyperlipidemia Sister   . Colon cancer Maternal Grandmother     in her 80's    ROS:  Pertinent items are noted in HPI.  Otherwise, a comprehensive ROS was negative.  Exam:   BP 108/72 mmHg  Pulse 80  Ht 5' 6.25" (1.683 m)  Wt 192 lb (87.091 kg)  BMI 30.75 kg/m2  LMP 10/06/2015 (Exact Date) Height: 5' 6.25" (168.3 cm) Ht Readings from Last 3 Encounters:  10/15/15 5' 6.25" (1.683 m)  12/07/13  (1.676 m)  12/03/13  (1.676 m)    General  appearance: alert, cooperative and appears stated age  Very nervous and anxious Head: Normocephalic, without obvious abnormality, atraumatic Neck: no adenopathy, supple, symmetrical, trachea midline and thyroid normal to inspection and palpation Lungs: clear to auscultation bilaterally Breasts: normal appearance, no masses or tenderness Heart: regular rate and rhythm Abdomen: soft, non-tender; no masses,  no organomegaly Extremities: extremities normal, atraumatic, no cyanosis or edema Skin: Skin color, texture, turgor normal. No rashes or lesions Lymph nodes: Cervical, supraclavicular, and axillary nodes normal. No abnormal inguinal nodes palpated Neurologic: Grossly normal  Declines nurse to be present for exam  Pelvic: External genitalia:  no lesions              Urethra:  normal appearing urethra with no masses, tenderness or lesions              Bartholin's and Skene's: normal                 Vagina: normal  appearing vagina with normal color and discharge, no lesions              Cervix: anteverted only partially visualized due to cooperation and comfort              Pap taken: Yes.   Bimanual Exam:  Uterus:  normal size, contour, position, consistency, mobility, non-tender              Adnexa: no mass, fullness, tenderness               Rectovaginal: Confirms               Anus:  normal sphincter tone, no lesions  Chaperone present: no  A:  Well Woman with normal exam  Menstrual irregularity for > 3 months with oligomenorrhea  Not SA - same sex partner  R/O STD's  Update Immunization  History of anxiety and is working with therapist for past 8 months  FMH: breast cancer - mother age 39 - almost 20 yrs survivor  P:   Reviewed health and wellness pertinent to exam  Pap smear as above  Will follow with pap and labs  TDaP is given today  Will start on Provera 10 mg daily X 10 and expect a withdrawal bleed - she will call back with outcome of challenge.  If labs are normal and menses goes back to normal, then no other intervention at this time.  If labs abnormal and bleeding continues will need PUS. SHGM / endo biopsy.  Counseled on breast self exam, mammography screening, adequate intake of calcium and vitamin D, diet and exercise return annually or prn  An After Visit Summary was printed and given to the patient.

## 2015-10-16 ENCOUNTER — Other Ambulatory Visit: Payer: Self-pay | Admitting: Nurse Practitioner

## 2015-10-16 DIAGNOSIS — E559 Vitamin D deficiency, unspecified: Secondary | ICD-10-CM

## 2015-10-16 LAB — LIPID PANEL
CHOL/HDL RATIO: 3 ratio (ref <=5.0)
Cholesterol: 177 mg/dL (ref 125–200)
HDL: 59 mg/dL (ref >=46)
LDL Cholesterol: 102 mg/dL (ref <130)
Triglycerides: 80 mg/dL (ref <150)
VLDL: 16 mg/dL (ref <30)

## 2015-10-16 LAB — PROLACTIN: PROLACTIN: 6.2 ng/mL

## 2015-10-16 LAB — STD PANEL
HIV: NONREACTIVE
Hepatitis B Surface Ag: NEGATIVE

## 2015-10-16 LAB — VITAMIN D 25 HYDROXY (VIT D DEFICIENCY, FRACTURES): VIT D 25 HYDROXY: 19 ng/mL — AB (ref 30–100)

## 2015-10-16 LAB — COMPREHENSIVE METABOLIC PANEL
ALBUMIN: 4.3 g/dL (ref 3.6–5.1)
ALT: 7 U/L (ref 6–29)
AST: 9 U/L — ABNORMAL LOW (ref 10–30)
Alkaline Phosphatase: 59 U/L (ref 33–115)
BUN: 16 mg/dL (ref 7–25)
CHLORIDE: 103 mmol/L (ref 98–110)
CO2: 25 mmol/L (ref 20–31)
CREATININE: 0.61 mg/dL (ref 0.50–1.10)
Calcium: 9.7 mg/dL (ref 8.6–10.2)
Glucose, Bld: 86 mg/dL (ref 65–99)
Potassium: 4.5 mmol/L (ref 3.5–5.3)
SODIUM: 137 mmol/L (ref 135–146)
Total Bilirubin: 0.4 mg/dL (ref 0.2–1.2)
Total Protein: 6.7 g/dL (ref 6.1–8.1)

## 2015-10-16 LAB — THYROID PANEL WITH TSH
Free Thyroxine Index: 2.1 (ref 1.4–3.8)
T3 UPTAKE: 30 % (ref 22–35)
T4 TOTAL: 7.1 ug/dL (ref 4.5–12.0)
TSH: 0.59 u[IU]/mL (ref 0.350–4.500)

## 2015-10-16 NOTE — Addendum Note (Signed)
Addended by: Roanna BanningGRUBB, Faraaz Wolin R on: 10/16/2015 10:43 AM   Modules accepted: Orders

## 2015-10-16 NOTE — Progress Notes (Signed)
Encounter reviewed by Dr. Nalin Mazzocco Amundson C. Silva.  

## 2015-10-20 LAB — IPS N GONORRHOEA AND CHLAMYDIA BY PCR

## 2015-10-22 ENCOUNTER — Other Ambulatory Visit: Payer: Self-pay | Admitting: Nurse Practitioner

## 2015-10-22 LAB — IPS PAP TEST WITH HPV

## 2015-10-22 MED ORDER — METRONIDAZOLE 0.75 % VA GEL: 1.0000 | Freq: Every day | VAGINAL | Status: DC

## 2015-10-23 ENCOUNTER — Other Ambulatory Visit: Payer: Self-pay

## 2015-10-23 ENCOUNTER — Telehealth: Payer: Self-pay | Admitting: Nurse Practitioner

## 2015-10-23 DIAGNOSIS — Z803 Family history of malignant neoplasm of breast: Secondary | ICD-10-CM

## 2015-10-23 DIAGNOSIS — Z1231 Encounter for screening mammogram for malignant neoplasm of breast: Secondary | ICD-10-CM

## 2015-10-23 MED ORDER — VITAMIN D (ERGOCALCIFEROL) 1.25 MG (50000 UNIT) PO CAPS: 50000.0000 [IU] | ORAL_CAPSULE | ORAL | Status: DC

## 2015-10-23 NOTE — Telephone Encounter (Signed)
Patient called and said, "I just got notification from my pharmacy that a prescription was sent in from your office. Are my results ready and what is the medication for?"

## 2015-10-23 NOTE — Telephone Encounter (Addendum)
Spoke with patient. All results given as seen below. Patient is agreeable and verbalizes understanding. Rx for Vitamin D 50,000 IU weekly #12 0RF sent to pharmacy on file. 3 month lab recheck scheduled for 12/31/2015 at 1:30 pm. Agreeable to date and time. Patient will take Provera and return call with results. Aware it can take up to 2 weeks after completion of Provera to have any bleeding if she is going to have any.  Notes Recorded by Ria CommentPatricia Grubb, FNP on 10/22/2015 at 5:46 PM Please let patient know that pap is normal with negative HR HPV (02). Also showing BV - med's will be sent to her pharmacy for treatment. Notes Recorded by Ria CommentPatricia Grubb, FNP on 10/21/2015 at 7:41 PM Let patient know that GC and Chl is negative. Notes Recorded by Ria CommentPatricia Grubb, FNP on 10/16/2015 at 9:34 AM Please let pt know that HIV, Hep B, STS is all negative. The hormone test we did with the Prolactin, thyroid panel with TSH was normal and not the cause of abnormal menses. We will continue as planned with the Provera and she will call back with a response after her next menses. The lipid panel & CMP also was normal. The Vit D was low at 19 and should follow Vit D protocol and recheck Vit D in 3 months.  Routing to provider for final review. Patient agreeable to disposition. Will close encounter.

## 2015-11-12 ENCOUNTER — Ambulatory Visit
Admission: RE | Admit: 2015-11-12 | Discharge: 2015-11-12 | Disposition: A | Discharge status: Home / Self Care | Payer: 59 | Source: Ambulatory Visit

## 2015-11-12 DIAGNOSIS — Z1231 Encounter for screening mammogram for malignant neoplasm of breast: Secondary | ICD-10-CM

## 2015-11-12 DIAGNOSIS — Z803 Family history of malignant neoplasm of breast: Secondary | ICD-10-CM

## 2015-12-31 ENCOUNTER — Other Ambulatory Visit: Payer: 59

## 2015-12-31 DIAGNOSIS — E559 Vitamin D deficiency, unspecified: Secondary | ICD-10-CM

## 2016-01-01 ENCOUNTER — Other Ambulatory Visit: Payer: Self-pay | Admitting: Nurse Practitioner

## 2016-01-01 ENCOUNTER — Telehealth: Payer: Self-pay

## 2016-01-01 DIAGNOSIS — E559 Vitamin D deficiency, unspecified: Secondary | ICD-10-CM

## 2016-01-01 LAB — VITAMIN D 25 HYDROXY (VIT D DEFICIENCY, FRACTURES): Vit D, 25-Hydroxy: 18 ng/mL — ABNORMAL LOW (ref 30–100)

## 2016-01-01 MED ORDER — VITAMIN D (ERGOCALCIFEROL) 1.25 MG (50000 UNIT) PO CAPS
ORAL_CAPSULE | ORAL | Status: DC
Start: 2016-01-01 — End: 2016-03-31

## 2016-01-01 NOTE — Telephone Encounter (Signed)
Left message to call Kaitlyn at [phone]. 

## 2016-01-01 NOTE — Telephone Encounter (Signed)
Thank you for the feedback - she does need to call if no menses in 3 months.  She now needs to be on RX Vit D 50,000 IU twice a week and recheck in 3 months.  Have to get some dark green vegetables - hopefully fresh is best.  Order is placed for Vit D in 3 months.

## 2016-01-01 NOTE — Telephone Encounter (Signed)
Spoke with patient. Advised of message as seen below from Ria Comment, FNP she is agreeable and verbalizes understanding. Rx for Vitamin D 50,000 IU take 1 tablet twice per week #24 0RF sent to pharmacy on file. Lab recheck scheduled for 04/07/2016 at 10 am. Agreeable to date and time. She will keep menses calendar and contact the office if no menses for 3 months in a row.  Routing to provider for final review. Patient agreeable to disposition. Will close encounter.

## 2016-01-01 NOTE — Telephone Encounter (Signed)
Spoke with patient. Patient took Provera and states at first she was bleeding every 2 weeks. She started her LMP on 11/18/2015-11/25/2015. Reports she has not had any bleeding since. Advised she will need to monitor her bleeding pattern. If she does not have cycle for 3 months in a row she will need to call the office. She is agreeable. Reports she did take the Vitamin D 50,000 IU weekly. Advised I will provide Ria Comment, FNP with an update and return call with additional recommendations she is agreeable.

## 2016-01-01 NOTE — Telephone Encounter (Signed)
-----   Message from Ria Comment, FNP sent at 01/01/2016  8:17 AM EST ----- Please call patient for 2 things:  Did she get a withdrawal bleed from the Provera challenge?  Did she take RX of Vit D or OTC because her Vit D level did not improve - actually went down a point.

## 2016-01-30 ENCOUNTER — Other Ambulatory Visit: Payer: Self-pay | Admitting: Nurse Practitioner

## 2016-01-30 NOTE — Telephone Encounter (Signed)
Looks like with phone call in February she was having missed cycles and was to call if no menses in 3 months.  Pharmacy has sent a refill request.  Please call her and tell her that we still need a progress report of her withdrawal bleeding. Do we have a record of last withdrawal bleed?

## 2016-01-30 NOTE — Telephone Encounter (Signed)
Medication refill request: Provera Last AEX:  10-15-15  Next AEX: 10-20-16 Last MMG (if hormonal medication request): 11-12-15 WNL  Refill authorized: please advise

## 2016-02-01 NOTE — Telephone Encounter (Signed)
Spoke with patient. Patient states that she did have bleeding after taking the Provera. Reports her last episode of bleeding since taking the Provera was last month. Has light spotting for 10 days. Patient is unsure of the days this occurred. Advised she does not need another dose of Provera at this time. Advised she will need to continue to monitor for any further bleeding and keep a bleeding calendar. If she does not have any bleeding for 3 months she will need to contact the office. She is agreeable.  Ria CommentPatricia Grubb, FNP, I am unable to close the encounter due to the pending medication.

## 2016-03-31 ENCOUNTER — Other Ambulatory Visit: Payer: Self-pay | Admitting: Nurse Practitioner

## 2016-04-01 ENCOUNTER — Other Ambulatory Visit: Payer: Self-pay | Admitting: Nurse Practitioner

## 2016-04-01 NOTE — Telephone Encounter (Signed)
Medication refill request: Vitamin D  Last AEX:  10-15-15  Next AEX: 10-20-16 Last MMG (if hormonal medication request): 11-12-15 WNL Refill authorized: please advise

## 2016-04-03 ENCOUNTER — Telehealth: Payer: Self-pay | Admitting: Nurse Practitioner

## 2016-04-03 NOTE — Telephone Encounter (Signed)
Left patient a message to call back when ready to reschedule, canceled by automated reminder call 04/07/16 vit d lab appointment.

## 2016-04-07 ENCOUNTER — Other Ambulatory Visit: Payer: 59

## 2016-04-07 ENCOUNTER — Other Ambulatory Visit (INDEPENDENT_AMBULATORY_CARE_PROVIDER_SITE_OTHER): Payer: 59

## 2016-04-07 DIAGNOSIS — E559 Vitamin D deficiency, unspecified: Secondary | ICD-10-CM

## 2016-04-08 LAB — VITAMIN D 25 HYDROXY (VIT D DEFICIENCY, FRACTURES): VIT D 25 HYDROXY: 38 ng/mL (ref 30–100)

## 2016-05-09 ENCOUNTER — Other Ambulatory Visit: Payer: Self-pay | Admitting: Nurse Practitioner

## 2016-05-12 MED ORDER — VITAMIN D (ERGOCALCIFEROL) 1.25 MG (50000 UNIT) PO CAPS: 50000.0000 [IU] | ORAL_CAPSULE | ORAL | Status: DC

## 2016-05-12 NOTE — Addendum Note (Signed)
Addended by: Roanna BanningGRUBB, Takari Duncombe R on: 05/12/2016 04:36 PM   Modules accepted: Orders

## 2016-09-18 DIAGNOSIS — E881 Lipodystrophy, not elsewhere classified: Secondary | ICD-10-CM | POA: Insufficient documentation

## 2016-09-24 HISTORY — PX: OTHER SURGICAL HISTORY: SHX169

## 2016-09-27 HISTORY — PX: MASTOPEXY: SHX5358

## 2016-10-12 ENCOUNTER — Emergency Department (HOSPITAL_COMMUNITY)
Admission: EM | Admit: 2016-10-12 | Discharge: 2016-10-12 | Disposition: A | Discharge status: Home / Self Care | Payer: 59 | Attending: Emergency Medicine | Admitting: Emergency Medicine

## 2016-10-12 ENCOUNTER — Encounter (HOSPITAL_COMMUNITY): Payer: Self-pay | Admitting: Emergency Medicine

## 2016-10-12 ENCOUNTER — Emergency Department (HOSPITAL_COMMUNITY): Payer: 59

## 2016-10-12 DIAGNOSIS — F17210 Nicotine dependence, cigarettes, uncomplicated: Secondary | ICD-10-CM | POA: Diagnosis not present

## 2016-10-12 DIAGNOSIS — Z79899 Other long term (current) drug therapy: Secondary | ICD-10-CM | POA: Diagnosis not present

## 2016-10-12 DIAGNOSIS — K5903 Drug induced constipation: Secondary | ICD-10-CM | POA: Diagnosis not present

## 2016-10-12 DIAGNOSIS — K5900 Constipation, unspecified: Secondary | ICD-10-CM | POA: Diagnosis present

## 2016-10-12 DIAGNOSIS — J45909 Unspecified asthma, uncomplicated: Secondary | ICD-10-CM | POA: Diagnosis not present

## 2016-10-12 LAB — COMPREHENSIVE METABOLIC PANEL
ALT: 14 U/L (ref 14–54)
ANION GAP: 5 (ref 5–15)
AST: 17 U/L (ref 15–41)
Albumin: 3.3 g/dL — ABNORMAL LOW (ref 3.5–5.0)
Alkaline Phosphatase: 39 U/L (ref 38–126)
BUN: 14 mg/dL (ref 6–20)
CHLORIDE: 108 mmol/L (ref 101–111)
CO2: 26 mmol/L (ref 22–32)
Calcium: 8.3 mg/dL — ABNORMAL LOW (ref 8.9–10.3)
Creatinine, Ser: 0.51 mg/dL (ref 0.44–1.00)
Glucose, Bld: 93 mg/dL (ref 65–99)
POTASSIUM: 3.4 mmol/L — AB (ref 3.5–5.1)
Sodium: 139 mmol/L (ref 135–145)
TOTAL PROTEIN: 5.6 g/dL — AB (ref 6.5–8.1)
Total Bilirubin: 0.6 mg/dL (ref 0.3–1.2)

## 2016-10-12 LAB — CBC WITH DIFFERENTIAL/PLATELET
Basophils Absolute: 0 10*3/uL (ref 0.0–0.1)
Basophils Relative: 0 %
Eosinophils Absolute: 0.2 10*3/uL (ref 0.0–0.7)
Eosinophils Relative: 2 %
HEMATOCRIT: 33.9 % — AB (ref 36.0–46.0)
HEMOGLOBIN: 11.3 g/dL — AB (ref 12.0–15.0)
LYMPHS ABS: 1.4 10*3/uL (ref 0.7–4.0)
Lymphocytes Relative: 13 %
MCH: 29.7 pg (ref 26.0–34.0)
MCHC: 33.3 g/dL (ref 30.0–36.0)
MCV: 89 fL (ref 78.0–100.0)
MONO ABS: 0.8 10*3/uL (ref 0.1–1.0)
MONOS PCT: 8 %
NEUTROS ABS: 8.5 10*3/uL — AB (ref 1.7–7.7)
NEUTROS PCT: 77 %
Platelets: 274 10*3/uL (ref 150–400)
RBC: 3.81 MIL/uL — ABNORMAL LOW (ref 3.87–5.11)
RDW: 13.9 % (ref 11.5–15.5)
WBC: 10.9 10*3/uL — ABNORMAL HIGH (ref 4.0–10.5)

## 2016-10-12 LAB — URINALYSIS, ROUTINE W REFLEX MICROSCOPIC
Bilirubin Urine: NEGATIVE
Glucose, UA: NEGATIVE mg/dL
Hgb urine dipstick: NEGATIVE
Ketones, ur: NEGATIVE mg/dL
LEUKOCYTES UA: NEGATIVE
NITRITE: NEGATIVE
PROTEIN: NEGATIVE mg/dL
SPECIFIC GRAVITY, URINE: 1.003 — AB (ref 1.005–1.030)
pH: 7.5 (ref 5.0–8.0)

## 2016-10-12 LAB — I-STAT BETA HCG BLOOD, ED (MC, WL, AP ONLY)

## 2016-10-12 MED ORDER — POLYETHYLENE GLYCOL 3350 17 GM/SCOOP PO POWD: 17.0000 g | Freq: Every day | ORAL | 0 refills | Status: DC

## 2016-10-12 MED ORDER — SODIUM CHLORIDE 0.9 % IJ SOLN
INTRAMUSCULAR | Status: AC
  Filled 2016-10-12: qty 50

## 2016-10-12 MED ORDER — IOPAMIDOL (ISOVUE-300) INJECTION 61%
INTRAVENOUS | Status: AC
  Filled 2016-10-12: qty 100

## 2016-10-12 MED ORDER — IOPAMIDOL (ISOVUE-300) INJECTION 61%
100.0000 mL | Freq: Once | INTRAVENOUS | Status: AC | PRN
  Administered 2016-10-12: 100 mL via INTRAVENOUS

## 2016-10-12 MED ORDER — SORBITOL 70 % SOLN
960.0000 mL | TOPICAL_OIL | Freq: Once | ORAL | Status: AC
  Administered 2016-10-12: 960 mL via RECTAL
  Filled 2016-10-12: qty 240

## 2016-10-12 NOTE — ED Notes (Signed)
Bed: [facility] Expected date:  Expected time:  Means of arrival:  Comments: 

## 2016-10-12 NOTE — ED Provider Notes (Signed)
[facility]-EMERGENCY DEPT Provider Note   CSN: [id number] Arrival date & time: 10/12/16  1402     History   Chief Complaint Chief Complaint  Patient presents with  . Constipation    HPI Kayla BottomsJoanne Walsh is a 40 y.o. female.  HPI  40 year old female who presents with constipation. She is postop day 5 from breast left and excess skin removal from bilateral upper arms after significant weight loss recently. States that she was placed in oxycodone postoperatively for pain control. She initially was taking it every 4 hours as was prescribed without any stool softeners. States that she has been constipated since the day before her surgery. Did have a tiny ball of hard stool 4 days ago but nothing since then. Try taking senna over the past 3 days, and then magnesium citrate with Dulcolax suppository today. Has not pooped and complains of low abdominal cramping and pelvic pressure. Complains of nausea but no vomiting. Denies any fevers, chills, urinary complaints.  Past Medical History:  Diagnosis Date  . Allergy   . Anxiety   . Arthritis   . Asthma     There are no active problems to display for this patient.   Past Surgical History:  Procedure Laterality Date  . BREAST SURGERY    . FINGER SURGERY Right age 40   thumb    OB History    Gravida Para Term Preterm AB Living   0 0 0 0 0 0   SAB TAB Ectopic Multiple Live Births   0 0 0 0         Home Medications    Prior to Admission medications   Medication Sig Start Date End Date Taking? Authorizing Provider  acetaminophen (TYLENOL) 325 MG tablet Take 650 mg by mouth every 6 (six) hours. 10/08/16  Yes Historical Provider, MD  BIOTIN PO Take 1 tablet by mouth.   Yes Historical Provider, MD  cholecalciferol (VITAMIN D) 1000 units tablet Take 2,000 Units by mouth daily.   Yes Historical Provider, MD  MELATONIN PO Take 1 tablet by mouth at bedtime as needed (sleep).   Yes Historical Provider, MD  ondansetron (ZOFRAN-ODT) 4 MG  disintegrating tablet Take 4 mg by mouth 4 (four) times daily as needed for nausea/vomiting. 10/08/16  Yes Historical Provider, MD  oxyCODONE (OXY IR/ROXICODONE) 5 MG immediate release tablet Take 5-10 mg by mouth every 4 (four) hours as needed for pain. Use for up to 7 days after procedure 10/08/16 10/15/16 Yes Historical Provider, MD  albuterol (PROVENTIL HFA;VENTOLIN HFA) 108 (90 BASE) MCG/ACT inhaler Inhale 2 puffs into the lungs every 6 (six) hours as needed for wheezing. Patient not taking: Reported on 10/12/2016 04/05/13   Kayla P Le, DO  medroxyPROGESTERone (PROVERA) 10 MG tablet Take 1 tablet (10 mg total) by mouth daily. Patient not taking: Reported on 10/12/2016 10/15/15   Ria CommentPatricia Grubb, FNP  metroNIDAZOLE (METROGEL) 0.75 % vaginal gel Place 1 Applicatorful vaginally at bedtime. Patient not taking: Reported on 10/12/2016 10/22/15   Ria CommentPatricia Grubb, FNP  polyethylene glycol powder (GLYCOLAX/MIRALAX) powder Take 17 g by mouth daily. 10/12/16   Lavera Guiseana Duo Ranya Fiddler, MD  Vitamin D, Ergocalciferol, (DRISDOL) 50000 units CAPS capsule TAKE 1 CAPSULE BY MOUTH 2 TIMES EVERY WEEK 04/01/16   Ria CommentPatricia Grubb, FNP  Vitamin D, Ergocalciferol, (DRISDOL) 50000 units CAPS capsule Take 1 capsule (50,000 Units total) by mouth every 7 (seven) days. Patient not taking: Reported on 10/12/2016 05/12/16   Ria CommentPatricia Grubb, FNP    Family History Family History  Problem Relation Age of Onset  . Hyperlipidemia Mother   . Hypertension Mother   . Breast cancer Mother 149    chemo, radiation, mastectomy  . Hypertension Father   . Hyperlipidemia Father   . Hyperlipidemia Sister   . Hypertension Sister   . Heart failure Paternal Grandmother     70's  . Heart failure Paternal Grandfather     2881  . Prostate cancer Paternal Grandfather   . Hyperlipidemia Sister   . Hypertension Sister   . Hypertension Sister   . Hyperlipidemia Sister   . Colon cancer Maternal Grandmother     in her 380's    Social History Social  History  Substance Use Topics  . Smoking status: Current Every Day Smoker    Packs/day: 0.75    Years: 24.00    Types: Cigarettes  . Smokeless tobacco: Never Used  . Alcohol use No     Comment: occasional     Allergies   Zyban [bupropion] and Lamisil [terbinafine hcl]   Review of Systems Review of Systems 10/14 systems reviewed and are negative other than those stated in the HPI   Physical Exam Updated Vital Signs BP 138/75 (BP Location: Left Leg)   Pulse 66   Temp 97.4 F (36.3 C) (Oral)   Resp 20   Ht 5\' 6"  (1.676 m)   Wt 180 lb (81.6 kg)   LMP 10/05/2016   SpO2 100%   BMI 29.05 kg/m   Physical Exam Physical Exam  Nursing note and vitals reviewed. Constitutional: Well developed, well nourished, non-toxic, and in no acute distress Head: Normocephalic and atraumatic.  Mouth/Throat: Oropharynx is clear and moist.  Neck: Normal range of motion. Neck supple.  Cardiovascular: Normal rate and regular rhythm.   Pulmonary/Chest: Effort normal and breath sounds normal.  Abdominal: Soft. No distention. There is mild low abdominal discomfort. No fecal impaction on rectal exam. There is no rebound and no guarding.  Musculoskeletal: Normal range of motion.  Neurological: Alert, no facial droop, fluent speech, moves all extremities symmetrically Skin: Skin is warm and dry.  Psychiatric: Cooperative   ED Treatments / Results  Labs (all labs ordered are listed, but only abnormal results are displayed) Labs Reviewed  CBC WITH DIFFERENTIAL/PLATELET - Abnormal; Notable for the following:       Result Value   WBC 10.9 (*)    RBC 3.81 (*)    Hemoglobin 11.3 (*)    HCT 33.9 (*)    Neutro Abs 8.5 (*)    All other components within normal limits  COMPREHENSIVE METABOLIC PANEL - Abnormal; Notable for the following:    Potassium 3.4 (*)    Calcium 8.3 (*)    Total Protein 5.6 (*)    Albumin 3.3 (*)    All other components within normal limits  URINALYSIS, ROUTINE W REFLEX  MICROSCOPIC (NOT AT [facility]) - Abnormal; Notable for the following:    Specific Gravity, Urine 1.003 (*)    All other components within normal limits  I-STAT BETA HCG BLOOD, ED ([facility], [facility], [facility] ONLY)    EKG  EKG Interpretation None       Radiology Ct Abdomen Pelvis W Contrast  Result Date: 10/12/2016 CLINICAL DATA:  40 year old female with severe constipation. Recent narcotics following flank / axillary area surgery to remove excess skin. Initial encounter. EXAM: CT ABDOMEN AND PELVIS WITH CONTRAST TECHNIQUE: Multidetector CT imaging of the abdomen and pelvis was performed using the standard protocol following bolus administration of intravenous contrast. CONTRAST:  ISOVUE-300 IOPAMIDOL (ISOVUE-300) INJECTION 61% COMPARISON:  Acute abdominal radiographic series 1655 hours today. FINDINGS: Lower chest: Negative.  No pericardial or pleural effusion. No upper abdominal free air. Hepatobiliary: Negative. No definite right upper quadrant free fluid. Pancreas: Negative. Spleen: Negative. Adrenals/Urinary Tract: Negative adrenal glands. Bilateral renal enhancement is within normal limits ; there is a small benign appearing cortical cyst at the left midpole and there is mild rotation of the right kidney (normal variant). Stomach/Bowel: Circumferential rectal wall thickening on series 2, image 73 up to 9-10 mm. This continues toward the inguinal verge (image 77). Fluid in the rectum at this level. Redundant sigmoid colon with fluid distally and retained stool proximally. No definite sigmoid wall thickening. Retained stool throughout the descending colon. Redundant splenic flexure with retained stool but then there is fluid throughout the transverse and right colon. Negative retrocecal appendix containing gas (series 2, image 49). Fluid in nondilated small bowel loops. Fairly decompressed stomach and duodenum. Vascular/Lymphatic: Suboptimal intravascular contrast bolus. Major arterial structures in the abdomen  and pelvis appear patent. Portal venous system appears grossly patent. No lymphadenopathy identified. Reproductive: Negative. Other: Small volume pelvic free fluid. Mildly asymmetric soft tissue stranding about the bilateral lateral chest walls and right flank. No subcutaneous gas identified. Partially visible bilateral lateral chest wall postoperative drains (series 2, image 1). Musculoskeletal: Age advanced lower lumbar disc and endplate degeneration. No acute osseous abnormality identified. IMPRESSION: 1. Circumferential wall thickening of the rectum suggesting Acute Proctitis. Fluid throughout the proximal and distal colon with intervening retained stool from the splenic flexure to the sigmoid. The fluid could be related to diarrhea or less likely enema use. 2. Small volume pelvic free fluid is nonspecific. No free air. No dilated small bowel to suggest mechanical bowel obstruction. Normal appendix. 3. Postoperative bilateral lateral chest wall and right flank soft tissue stranding. There are small partially visible right lateral chest wall postoperative drains. No visible subcutaneous gas. Electronically Signed   By: Odessa Fleming M.D.   On: 10/12/2016 19:13   Dg Abdomen Acute W/chest  Result Date: 10/12/2016 CLINICAL DATA:  Constipation with cramping and nausea EXAM: DG ABDOMEN ACUTE W/ 1V CHEST COMPARISON:  None. FINDINGS: PA chest: No edema or consolidation. Heart size and pulmonary vascularity are normal. No adenopathy. Supine and upright abdomen: There is moderate stool throughout colon. There is no bowel dilatation. There are scattered air-fluid levels in the abdomen and upper pelvis. No free air. There are phleboliths in the pelvis. IMPRESSION: Scattered air-fluid levels without bowel dilatation. Suspect early ileus or enteritis. Moderate stool in colon. No free air. No lung edema or consolidation. Electronically Signed   By: Bretta Bang III M.D.   On: 10/12/2016 17:14    Procedures Procedures  (including critical care time)  Medications Ordered in ED Medications  iopamidol (ISOVUE-300) 61 % injection (not administered)  sodium chloride 0.9 % injection (not administered)  sorbitol, milk of mag, mineral oil, glycerin (SMOG) enema (960 mLs Rectal Given 10/12/16 1553)  iopamidol (ISOVUE-300) 61 % injection 100 mL (100 mLs Intravenous Contrast Given 10/12/16 1847)     Initial Impression / Assessment and Plan / ED Course  I have reviewed the triage vital signs and the nursing notes.  Pertinent labs & imaging results that were available during my care of the patient were reviewed by me and considered in my medical decision making (see chart for details).  Clinical Course     40 year old female presenting with constipation in the setting of taking  oxycodone. He is nontoxic in no acute distress with normal vital signs. Overall soft and benign abdomen with generalized tenderness. Blood work overall reassuring she is not pregnant. No evidence of fecal impaction. Did receive smog enema and had a large bowel movement, and feels significantly improved. X-ray does show air-fluid levels. ? Potential underlying process such as ileus or obstruction. CT performed, visualized, showing proctitis, likely secondary to her enema. No other acute intra-abdominal processes noted. Patient stable for discharge. Discussed use of MiraLAX, discontinuation of her oxycodone. She has follow-up with her surgeon tomorrow.  Strict return and follow-up instructions reviewed. She expressed understanding of all discharge instructions and felt comfortable with the plan of care.   Final Clinical Impressions(s) / ED Diagnoses   Final diagnoses:  Drug-induced constipation    New Prescriptions New Prescriptions   POLYETHYLENE GLYCOL POWDER (GLYCOLAX/MIRALAX) POWDER    Take 17 g by mouth daily.     Lavera Guise, MD 10/12/16 [phone]

## 2016-10-12 NOTE — ED Triage Notes (Signed)
Per EMS-had surgery on Tuesday, breast lift and excess skin removed from both upper arms-states she has been constipation for 5 days due to opioid use-discontinued opioid use on thursday due to constipation-having extreme abdominal pain

## 2016-10-12 NOTE — Discharge Instructions (Signed)
Please follow-up with your surgeon tomorrow as scheduled.  Return without fail for worsening symptoms, including intractable vomiting, worsening abdominal pain, fever, or any other symptoms concerning to you.  Start taking MiraLAX, once daily until you health normalized bowel movements. Stopped taking oxycodone during this time.

## 2016-10-20 ENCOUNTER — Ambulatory Visit: Payer: 59 | Admitting: Nurse Practitioner

## 2017-02-11 ENCOUNTER — Telehealth: Payer: Self-pay | Admitting: Nurse Practitioner

## 2017-02-11 NOTE — Telephone Encounter (Signed)
Spoke with patient. Patient states that two months ago her cycles began coming every 2-3 weeks. Reports she started her menses 3 weeks ago and is still having bleeding. Reports for 10 days in the middle of this cycle she was only having light spotting. On  02/09/2017 bleeding returned to a flow like a normal menses. Is changing her pad/tampon every 5 hours. Denies any fatigue, light headedness, weakness, or SOB. Advised patient she will need to be seen for further evaluation. Offered appointment today, but patient declines. Requesting appointment for 02/16/2017. Appointment scheduled for 02/16/2017 at 11 am with Ria CommentPatricia Grubb, FNP. Patient is agreeable to date and time. Advised if bleeding increases to having to change pad.tampon every hour for more than 2 hours or develops any new symptoms will need to be seen for immediate evaluation. Patient verbalizes understanding.  Routing to provider for final review. Patient agreeable to disposition. Will close encounter.

## 2017-02-11 NOTE — Telephone Encounter (Signed)
Patient called and said, "I need an appointment to see Ria CommentPatricia Grubb, FNP. I've been having my cycle for about three weeks with about 10 days of light spotting in between."  Patient last seen: 10/15/15

## 2017-02-16 ENCOUNTER — Ambulatory Visit (INDEPENDENT_AMBULATORY_CARE_PROVIDER_SITE_OTHER): Payer: 59 | Admitting: Nurse Practitioner

## 2017-02-16 ENCOUNTER — Encounter: Payer: Self-pay | Admitting: Nurse Practitioner

## 2017-02-16 VITALS — BP 118/70 | HR 72 | Resp 16 | Ht 66.0 in | Wt 192.0 lb

## 2017-02-16 DIAGNOSIS — N939 Abnormal uterine and vaginal bleeding, unspecified: Secondary | ICD-10-CM | POA: Diagnosis not present

## 2017-02-16 LAB — CBC
HCT: 40.8 % (ref 35.0–45.0)
HEMOGLOBIN: 13.1 g/dL (ref 11.7–15.5)
MCH: 27.4 pg (ref 27.0–33.0)
MCHC: 32.1 g/dL (ref 32.0–36.0)
MCV: 85.4 fL (ref 80.0–100.0)
MPV: 9.4 fL (ref 7.5–12.5)
Platelets: 316 10*3/uL (ref 140–400)
RBC: 4.78 MIL/uL (ref 3.80–5.10)
RDW: 16.5 % — ABNORMAL HIGH (ref 11.0–15.0)
WBC: 9.4 10*3/uL (ref 3.8–10.8)

## 2017-02-16 LAB — TSH: TSH: 0.62 mIU/L

## 2017-02-16 NOTE — Progress Notes (Signed)
41 y.o. Single mixed Asian and Caucasian female G0P0000 here for evaluation on irregular menses.  When she had her last AEX 09/2015 she had started having irregular menses lasting at about 2 weeks. She was given Provera challenge and cycles returned to normal.  Our plan was that if AUB again or persisted she was to have PUS/ SHGM. Her labs a year ago included a TSH, prolactin, STD's which were all normal.   Now patient states that two months ago her cycles began coming every 2-3 weeks again. Reports she started her menses 3 weeks ago on 01/23/17 and has had bleeding until this past Saturday.  Reports for 10 days in the middle of this cycle she was only having light spotting. On  02/09/2017 bleeding returned to a flow like a normal menses and stopped bleeding on 3/24.   She was changing her pad/tampon every 5 hours with what appeared to be a regular cycle and other days of spotting was light. Denies any fatigue, light headedness, weakness, or SOB. But she did find that she was craving meat and anything sweet.  She did have breast surgery with bilateral mastopexy and bilateral bronchioplasty on 10/07/16. Same sex partner and no changes.   O: Healthy WD,WN female Affect: normal Skin: she did remove her clothing to view the surgery on breast and arms.  They are healing but still tender on exam. Abdomen:soft, non tender Pelvic exam:EXTERNAL GENITALIA: normal appearing vulva with no masses, tenderness or lesions VAGINA: no abnormal discharge or lesions CERVIX: no lesions or cervical motion tenderness UTERUS: normal  A: AUB   P:  Discussed and will get labs done and get PUS/ SHGM and evaluate the AUB.  I did not give her Provera this time as bleeding had stopped.   Labs: CBC, HGB AIC, TSH   Instructions given regarding:  RV

## 2017-02-16 NOTE — Patient Instructions (Signed)

## 2017-02-16 NOTE — Progress Notes (Signed)
Encounter reviewed by Dr. Andrienne Havener Amundson C. Silva.  

## 2017-02-17 ENCOUNTER — Telehealth: Payer: Self-pay | Admitting: Obstetrics and Gynecology

## 2017-02-17 LAB — HEMOGLOBIN A1C
HEMOGLOBIN A1C: 5 % (ref <5.7)
MEAN PLASMA GLUCOSE: 97 mg/dL

## 2017-02-17 LAB — PROLACTIN: Prolactin: 8.8 ng/mL

## 2017-02-17 NOTE — Telephone Encounter (Signed)
Called patient to review benefits for a recommended sonohysterogram. Left Voicemail requesting a call back. °

## 2017-02-17 NOTE — Telephone Encounter (Signed)
Spoke with patient regarding recommended sonohysterogram. Patients insurance coverage will expire on 02/21/17, there are no appointments available for this type of appointment with Dr Edward JollySilva, prior to  02/21/17. We will need to schedule sonohysterogram after 02/22/17,  when patients new coverage becomes effective. Patient states she does not the new insurance information. Patient will call back to provide new insurance information, for pre-certification purposes. States she will call back on 02/18/17.  Forwarding to Dr Holly BodilySivla for review  cc: Billie RuddySally Yeakley

## 2017-02-17 NOTE — Telephone Encounter (Signed)
Please keep in your work queue so we can have her return hopefully as soon as next week.

## 2017-02-19 NOTE — Telephone Encounter (Signed)
Spoke with patient in regards to new coverage effective 02/22/17. Patient advised her coverage will remain with the same payer, and will renew on 02/22/17. I advised patient we will reverify and precert the recommended tests after 02/22/17. Patient is agreeable.  Routing to [employer] Yeak[employer]

## 2017-02-23 NOTE — Telephone Encounter (Signed)
Called patient to review benefits for a recommended procedure for her new insurance plan year. Left Voicemail requesting a call back.

## 2017-02-24 NOTE — Telephone Encounter (Signed)
Spoke with patient regarding benefit for sonohysterogram. Patient understood information presented. Patient states she is not ready to proceed with scheduling the recommended test and will call back when she is ready to proceed.   Routing to [employer] for review

## 2017-02-25 NOTE — Telephone Encounter (Signed)
Left message to call Aleda Madl at [phone].  

## 2017-02-25 NOTE — Telephone Encounter (Signed)
Patient called today, to advise she has decided to proceed with scheduling the recommended sonohysterogram. Patient is scheduled 03/05/17 with Dr Edward Jolly. Call forwarded to Carmelina Dane to answer questions regarding the test.  Routing to Billie Ruddy  cc: Carmelina Dane

## 2017-02-25 NOTE — Telephone Encounter (Signed)
Ok to keep current date for ultrasound and evaluation as long as no chance of pregnancy.

## 2017-02-25 NOTE — Telephone Encounter (Signed)
Spoke with patient. Patient asking what work restrictions she may have after SHGM. Advised patient may return to work after, may experience some mild cramping. Advised patient to take Motrin 800 mg with food and water one hour before procedure. Questions answered about SHGM procedure. Patient states cycles are irregular, last day of menses was 02/16/17. Advised patient to return call to office to notify if bleeding prior to [facility] appointment. Advised patient would update Dr. Edward Jolly with scheduled time, will return call if any additional recommendations. Patient verbalizes understanding and is agreeable.   Dr. Edward Jolly -ok to keep [facility] scheduled for 4/12?

## 2017-02-25 NOTE — Telephone Encounter (Signed)
Spoke with patient. Advised as seen below per Dr. Edward Jolly. Patient states no chance of pregnancy, is lesbian, has same sex partner. Advised patient will keep scheduled appointment for 03/05/17. Patient verbalizes understanding and is agreeable.  Routing to provider for final review. Patient is agreeable to disposition. Will close encounter.

## 2017-03-05 ENCOUNTER — Ambulatory Visit (INDEPENDENT_AMBULATORY_CARE_PROVIDER_SITE_OTHER): Payer: 59 | Admitting: Obstetrics and Gynecology

## 2017-03-05 ENCOUNTER — Other Ambulatory Visit: Payer: 59 | Admitting: Obstetrics and Gynecology

## 2017-03-05 ENCOUNTER — Encounter: Payer: Self-pay | Admitting: Obstetrics and Gynecology

## 2017-03-05 ENCOUNTER — Other Ambulatory Visit: Payer: Self-pay | Admitting: Obstetrics and Gynecology

## 2017-03-05 ENCOUNTER — Other Ambulatory Visit: Payer: 59

## 2017-03-05 ENCOUNTER — Other Ambulatory Visit: Payer: Self-pay

## 2017-03-05 ENCOUNTER — Ambulatory Visit (INDEPENDENT_AMBULATORY_CARE_PROVIDER_SITE_OTHER): Payer: 59

## 2017-03-05 VITALS — BP 110/64 | HR 60 | Ht 66.0 in | Wt 192.0 lb

## 2017-03-05 DIAGNOSIS — N939 Abnormal uterine and vaginal bleeding, unspecified: Secondary | ICD-10-CM

## 2017-03-05 DIAGNOSIS — N921 Excessive and frequent menstruation with irregular cycle: Secondary | ICD-10-CM

## 2017-03-05 NOTE — Progress Notes (Signed)
Encounter reviewed by Dr. Brook Amundson C. Silva.  

## 2017-03-05 NOTE — Progress Notes (Signed)
Patient ID: Kayla Walsh, female   DOB: February 13, 1976, 41 y.o.   MRN: [id number] GYNECOLOGY  VISIT   HPI: 41 y.o.   Single  Asian/Caucasian female   G0P0000 with Patient's last menstrual period was 01/23/2017 (approximate).   here for pelvic ultrasound and sonohysterogram for abnormal uterine bleeding.    Started abnormal uterine bleeding a few months prior to her first visit in Nov. 2016. Menses were occurring every 2 weeks.  Had a period of time where she skipped her cycles. Took course of Provera and menses regulated.  4 months ago, had menses occur more closely again, every 2 - 3 weeks.  No pain with menses.  Weight loss of 120 pounds over the last 2 years prompted by severe reflux. The last year, it has been very stable overall.   Has had hair loss.   Normal prolactin and TSH.   GYNECOLOGIC HISTORY: Patient's last menstrual period was 01/23/2017 (approximate). Contraception:  None--same sex partner Menopausal hormone therapy:  n/a Last mammogram: ??11-03-15 Density C/Neg/BiRads1:TBC Last pap smear: 10-15-15 Neg:Neg HR HPV;1995 Neg        OB History    Gravida Para Term Preterm AB Living   0 0 0 0 0 0   SAB TAB Ectopic Multiple Live Births   0 0 0 0           There are no active problems to display for this patient.   Past Medical History:  Diagnosis Date  . Allergy   . Anxiety   . Arthritis   . Asthma     Past Surgical History:  Procedure Laterality Date  . BREAST SURGERY    . FINGER SURGERY Right age 31   thumb  . MASTOPEXY Bilateral 09/27/2016   she also had bilateral bronchioplasty at same time.    Current Outpatient Prescriptions  Medication Sig Dispense Refill  . BIOTIN PO Take 1 tablet by mouth.    . Cholecalciferol (VITAMIN D3) 1000 UNIT/SPRAY LIQD Take by mouth. Patient taking a total of 50,000 units weekly (OTC)    . cyanocobalamin 1000 MCG tablet Take 1,000 mcg by mouth as needed.    Marland Kitchen MELATONIN PO Take 1 tablet by mouth at bedtime as needed  (sleep).     No current facility-administered medications for this visit.      ALLERGIES: Zyban [bupropion] and Lamisil [terbinafine hcl]  Family History  Problem Relation Age of Onset  . Hyperlipidemia Mother   . Hypertension Mother   . Breast cancer Mother 55    chemo, radiation, mastectomy  . Hypertension Father   . Hyperlipidemia Father   . Hyperlipidemia Sister   . Hypertension Sister   . Heart failure Paternal Grandmother     70's  . Heart failure Paternal Grandfather     36  . Prostate cancer Paternal Grandfather   . Hyperlipidemia Sister   . Hypertension Sister   . Hypertension Sister   . Hyperlipidemia Sister   . Colon cancer Maternal Grandmother     in her 30's    Social History   Social History  . Marital status: Single    Spouse name: N/A  . Number of children: N/A  . Years of education: N/A   Occupational History  . Not on file.   Social History Main Topics  . Smoking status: Current Every Day Smoker    Packs/day: 0.75    Years: 24.00    Types: Cigarettes  . Smokeless tobacco: Never Used  . Alcohol use No  Comment: occasional  . Drug use: No  . Sexual activity: No   Other Topics Concern  . Not on file   Social History Narrative  . No narrative on file    ROS:  Pertinent items are noted in HPI.  PHYSICAL EXAMINATION:    BP 110/64 (BP Location: Right Arm, Patient Position: Sitting, Cuff Size: Normal)   Pulse 60   Ht  (1.676 m)   Wt 192 lb (87.1 kg)   LMP 01/23/2017 (Approximate) Comment: bleeding most of March  BMI 30.99 kg/m     General appearance: alert, cooperative and appears stated age   Pelvic ultrasound: No myometrial masses.  EMS 8.21 mm with vascular flow.  Ovaries normal. No free fluid.   Technique:  Both transabdominal and transvaginal ultrasound examinations of the pelvis were performed. Transabdominal technique was performed for global imaging of the pelvis including uterus, ovaries, adnexal regions, and  pelvic cul-de-sac. It was necessary to proceed with endovaginal exam following the abdominal ultrasound.  Transabdominal exam to visualize the endometrium and adnexa.  Color and duplex Doppler ultrasound was utilized to evaluate blood flow to the ovaries.   Procedure - sonohysterogram Consent performed. Speculum placed in vagina. Sterile prep of cervix with  Hibiclens. Cannula placed inside endometrial cavity without difficulty. Speculum removed. Sterile saline injected.    12 x 18 mm fundal      filling defect noted. Cannula removed. No complication.   Procedure - endometrial biopsy Consent performed. Speculum place in vagina.  Sterile prep of cervix with Hibiclens. Tenaculum to anterior cervical lip. Paracervical block with 10 cc 1% lidocaine _______________ yes.  Lot [id number], exp 02/21. Pipelle placed to      6.5    cm without difficulty twice. Tissue obtained and sent to pathology. Speculum removed.  No complications. Minimal EBL.  Pelvic: External genitalia:  no lesions              Urethra:  normal appearing urethra with no masses, tenderness or lesions              Bartholins and Skenes: normal                 Vagina: normal appearing vagina with normal color and discharge, no lesions              Cervix: no lesions        Bimanual Exam:  Uterus:  normal size, contour, position, consistency, mobility, non-tender              Adnexa: normal adnexa and no mass, fullness, tenderness          Chaperone was present for exam.  ASSESSMENT  Metrorrhagia.  Suspect endometrial polyp.  Significant weight loss, stable recently.   PLAN  Discussed abnormal uterine bleeding - potential etiologies. Polyp, fibroids, hyperplasia, anovulatory bleeding, malignancy.   Await EMB results.  Discussion of anticipated hysteroscopy with Myosure polypectomy, dilation and curettage.  Risks, benefits, and alternatives reviewed. Risks include but are not limited to bleeding, infection, damage  to surrounding organs including uterine perforation requiring hospitalization and laparoscopy, pulmonary edema, reaction to anesthesia, DVT, PE, death, need for further treatment and surgery including repeat hysteroscopy, hysterectomy or medical therapy.   Surgical expectations and recovery discussed.  Patient prefers to avoid ongoing medical treatment with hormonal control of her bleeding.  ACOG HOs on hysteroscopy with dilation and curettage.   An After Visit Summary was printed and given to the patient.  _25_____ minutes face  to face time of which over 50% was spent in counseling.

## 2017-03-05 NOTE — Patient Instructions (Signed)

## 2017-03-06 ENCOUNTER — Telehealth: Payer: Self-pay | Admitting: Obstetrics and Gynecology

## 2017-03-06 ENCOUNTER — Telehealth: Payer: Self-pay | Admitting: Nurse Practitioner

## 2017-03-06 NOTE — Telephone Encounter (Signed)
Patient called after having her ultrasound with a biopsy yesterday to report some concerns about "bleeding today and feeling a little warm last night like a fever."  Last seen 03/06/17.

## 2017-03-06 NOTE — Telephone Encounter (Signed)
Spoke with patient. Advised of all information as seen below from Dr.Silva and left on patient's voicemail. Wanted to ensure patient received this message before the weekend. The patient verbalizes understanding.  Routing to provider for final review. Patient agreeable to disposition. Will close encounter.

## 2017-03-06 NOTE — Telephone Encounter (Signed)
Left message to call Pearly Bartosik at [phone]. 

## 2017-03-06 NOTE — Telephone Encounter (Signed)
Spoke with patient. Patient was seen for a SHGM and EMB yesterday with Dr.Silva. Patient states that for 2-3 hours after appointment she had light bleeding. Last night bleeding stopped, but she felt she may have a fever. Took her temperature which was 99.5 F. Patient woke up this morning and bleeding has increased. Reports since being at work this morning at 9 am she has only changed her pad twice. "It is just heavier than what a normal first day of my cycle would be." Denies any pain, fever, chills, nausea, vomiting. Reports she was told yesterday that she was close to starting her menses based on imaging and is unsure if this is from her cycle. Has checked her temperature today and it is normal at 98.3 F. Advised will review with Dr.Silva and return call with further recommendations. Patient is agreeable.

## 2017-03-06 NOTE — Telephone Encounter (Signed)
I agree with your recommendations.  I am reassured that the patient is not exhibiting signs of complication from the sonohysterogram and biopsy.  We will contact her next week with biopsy results and final planning.

## 2017-03-06 NOTE — Telephone Encounter (Signed)
Left detailed message at number provided [phone], okay per ROI. Advised of message as seen below from Dr.Silva. Advised to monitor symptoms and if bleeding increases to having to change her pad every hour due to bleeding through will need to be seen for immediate evaluation with ER over the weekend. Advised all symptoms at this time are normal and will need to continue to monitor. Will contact her once results have returned.

## 2017-03-09 ENCOUNTER — Encounter: Payer: Self-pay | Admitting: Nurse Practitioner

## 2017-03-09 ENCOUNTER — Ambulatory Visit: Payer: 59 | Admitting: Nurse Practitioner

## 2017-03-09 LAB — IPS OTHER TISSUE BIOPSY

## 2017-03-09 NOTE — Progress Notes (Deleted)
41 y.o. G0P0000 Single  Mixed Asian and Caucasian Fe here for annual exam.  Pt had AUB and had SHGM on 02/16/17 with a filling defects.  Then 4/12 had EMB and biopsy showed fragments of polypoid tissue.  No LMP recorded.          Sexually active: {yes no:314532}  The current method of family planning is {contraception:315051}.    Exercising: {yes no:314532}  {types:19826} Smoker:  {YES [id number]  Health Maintenance: Pap: 10/15/15, Negative with neg HR HPV History of Abnormal Pap: *** MMG:  *** Self Breast exams: *** TDaP:  10/15/15 HIV: 10/15/15 Labs: ***   reports that she has been smoking Cigarettes.  She has a 18.00 pack-year smoking history. She has never used smokeless tobacco. She reports that she does not drink alcohol or use drugs.  Past Medical History:  Diagnosis Date  . Allergy   . Anxiety   . Arthritis   . Asthma     Past Surgical History:  Procedure Laterality Date  . BREAST SURGERY    . FINGER SURGERY Right age 7   thumb  . MASTOPEXY Bilateral 09/27/2016   she also had bilateral bronchioplasty at same time.    Current Outpatient Prescriptions  Medication Sig Dispense Refill  . BIOTIN PO Take 1 tablet by mouth.    . Cholecalciferol (VITAMIN D3) 1000 UNIT/SPRAY LIQD Take by mouth. Patient taking a total of 50,000 units weekly (OTC)    . cyanocobalamin 1000 MCG tablet Take 1,000 mcg by mouth as needed.    Marland Kitchen MELATONIN PO Take 1 tablet by mouth at bedtime as needed (sleep).     No current facility-administered medications for this visit.     Family History  Problem Relation Age of Onset  . Hyperlipidemia Mother   . Hypertension Mother   . Breast cancer Mother 4    chemo, radiation, mastectomy  . Hypertension Father   . Hyperlipidemia Father   . Hyperlipidemia Sister   . Hypertension Sister   . Heart failure Paternal Grandmother     70's  . Heart failure Paternal Grandfather     25  . Prostate cancer Paternal Grandfather   . Hyperlipidemia Sister    . Hypertension Sister   . Hypertension Sister   . Hyperlipidemia Sister   . Colon cancer Maternal Grandmother     in her 80's    ROS:  Pertinent items are noted in HPI.  Otherwise, a comprehensive ROS was negative.  Exam:   There were no vitals taken for this visit.   Ht Readings from Last 3 Encounters:  03/05/17  (1.676 m)  02/16/17  (1.676 m)  10/12/16  (1.676 m)    General appearance: alert, cooperative and appears stated age Head: Normocephalic, without obvious abnormality, atraumatic Neck: no adenopathy, supple, symmetrical, trachea midline and thyroid {EXAM; THYROID:18604} Lungs: clear to auscultation bilaterally Breasts: {Exam; breast:13139::"normal appearance, no masses or tenderness"} Heart: regular rate and rhythm Abdomen: soft, non-tender; no masses,  no organomegaly Extremities: extremities normal, atraumatic, no cyanosis or edema Skin: Skin color, texture, turgor normal. No rashes or lesions Lymph nodes: Cervical, supraclavicular, and axillary nodes normal. No abnormal inguinal nodes palpated Neurologic: Grossly normal   Pelvic: External genitalia:  no lesions              Urethra:  normal appearing urethra with no masses, tenderness or lesions              Bartholin's and Skene's: normal  Vagina: normal appearing vagina with normal color and discharge, no lesions              Cervix: {exam; cervix:14595}              Pap taken: {yes no:314532} Bimanual Exam:  Uterus:  {exam; uterus:12215}              Adnexa: {exam; adnexa:12223}               Rectovaginal: Confirms               Anus:  normal sphincter tone, no lesions  Chaperone present: ***  A:  Well Woman with normal exam  Menstrual irregularity for > 4 months             Not SA - same sex partner             History of anxiety and is working with therapist for past 8 months             FMH: breast cancer - mother age 65 - almost 20 yrs survivor   P:   Reviewed  health and wellness pertinent to exam  Pap smear ***done/ not done  {plan; gyn:5269::"mammogram","pap smear","return annually or prn"}  An After Visit Summary was printed and given to the patient.

## 2017-03-11 ENCOUNTER — Telehealth: Payer: Self-pay | Admitting: Obstetrics and Gynecology

## 2017-03-11 NOTE — Telephone Encounter (Signed)
Patient said that she received a message from MyChart that her results were ready to view. Patient said that the results said to see progress notes with no notes attached.

## 2017-03-11 NOTE — Telephone Encounter (Signed)
Call to patient. Reviewed pathology report and recommendations as directed by Dr Edward Jolly. Patient desires to proceed with scheduling. Discussed date options and offered 03-16-17. Patient wants to plan for this but will need to confirm she can make arrangements. Will schedule and begin precertification process.

## 2017-03-11 NOTE — Telephone Encounter (Signed)
Spoke with patient regarding benefit for surgery. Patient understood and agreeable. Patient aware this is professional benefit only. Patient aware will be contacted by hospital for separate benefits. Patient is also aware we are in the process of obtaining the required prior authorization for the procedure on Monday, 03/16/17 at [facility].   CC: Dr Edward Jolly  cc: Billie Ruddy

## 2017-03-11 NOTE — Telephone Encounter (Signed)
Patient returns call. Confirms surgery date of Monday 03-16-17 at 1000 at [facility]. Surgery instruction sheet reviewed and printed copy will be given to patient at appointment on Friday 03-13-17.   Routing to provider for final review. Patient agreeable to disposition. Will close encounter.

## 2017-03-11 NOTE — Telephone Encounter (Signed)
Call to patient to confirm and complete details for surgery on Monday 03-16-17. Left message to call back.

## 2017-03-11 NOTE — Telephone Encounter (Signed)
Spoke with patient, advised of results and recommendations as seen below per Dr. Edward Jolly. Patient states she does want to move forward with scheduling hysteroscopy with Myosure and dilation and curettage as this was discussed at last OV. Advised patient would update Dr. Edward Jolly and return call with any additional recommendations. Advised Kennon Rounds is our office surgery coordinator, would return call for scheduling. Patient verbalizes understanding and is agreeable.   Dr. Edward Jolly, any additional recommendations?  Cc: Billie Ruddy  Notes recorded by [name], MD on 03/10/2017 at 7:58 PM EDT Please contact patient with EMB results showing polypoid fragments of secretory endometrium.  Her sonohysterogram evaluation showed a fundal area suspicious for a polyp.  We discussed anticipated hysteroscopy with dilation and curettage at that visit.  She has had long term irregular and frequent menses.  I recommend we proceed with hysteroscopy with Myosure and dilation and curettage.  Cc- Shirlyn Goltz

## 2017-03-11 NOTE — Telephone Encounter (Signed)
I am sending this message through for the patient's hysteroscopy with possible Myosure and dilation and curettage to be precerted and scheduled for her abnormal uterine bleeding.  Thank you to all.  Cc- Harland Dingwall and Billie Ruddy

## 2017-03-12 ENCOUNTER — Telehealth: Payer: Self-pay | Admitting: Obstetrics and Gynecology

## 2017-03-12 ENCOUNTER — Encounter (HOSPITAL_COMMUNITY): Payer: Self-pay | Admitting: *Deleted

## 2017-03-12 NOTE — Progress Notes (Signed)
GYNECOLOGY  VISIT   HPI: 41 y.o.   Single  Asian/Caucasian female   G0P0000 with Patient's last menstrual period was 03/06/2017 (exact date).   here for surgical consult.   Started abnormal uterine bleeding a few months prior to her first visit in Nov. 2016. Menses were occurring every 2 weeks.  Had a period of time where she skipped her cycles. Took course of Provera and menses regulated.  4 months ago, had menses occur more closely again, every 2 - 3 weeks.  No pain with menses.  Weight loss of 120 pounds over the last 2 years prompted by severe reflux. The last year, it has been very stable overall.   Has had hair loss.   Normal prolactin and TSH.   Pelvic ultrasound: No myometrial masses.  EMS 8.21 mm with vascular flow.  Ovaries normal. No free fluid.  Sonohysterogram shoed 12 x 18 mm filling defect.  EMB showed polypoid fragments of secretory endometrium.    GYNECOLOGIC HISTORY: Patient's last menstrual period was 03/06/2017 (exact date). Contraception: None--same sex partner Menopausal hormone therapy:  n/a Last mammogram: 11-03-15 Density C/Neg/BiRads1:TBC  Last pap smear: 10-15-15 Neg:Neg HR HPV;1995 Neg.          OB History    Gravida Para Term Preterm AB Living   0 0 0 0 0 0   SAB TAB Ectopic Multiple Live Births   0 0 0 0 0         There are no active problems to display for this patient.   Past Medical History:  Diagnosis Date  . Allergy   . Anemia    not recent  . Anxiety   . Arthritis   . Asthma   . GERD (gastroesophageal reflux disease)    no issues since weigh loss  . Pneumonia 1997  . PONV (postoperative nausea and vomiting)     Past Surgical History:  Procedure Laterality Date  . arm lift  09/2016  . breast lift  09/2016  . BREAST SURGERY    . FINGER SURGERY Right age 89   thumb  . MASTOPEXY Bilateral 09/27/2016   she also had bilateral bronchioplasty at same time.  . WISDOM TOOTH EXTRACTION      Current Outpatient  Prescriptions  Medication Sig Dispense Refill  . albuterol (PROVENTIL HFA;VENTOLIN HFA) 108 (90 Base) MCG/ACT inhaler Inhale 1-2 puffs into the lungs every 6 (six) hours as needed for wheezing or shortness of breath.    . Biotin 5 MG TABS Take 5 mg by mouth daily.    Marland Kitchen BIOTIN PO Take 1 tablet by mouth.    . Cholecalciferol (VITAMIN D3) 5000 units CAPS Take 5,000-10,000 Units by mouth See admin instructions. Takes 5000 units on tues thurs saturday and Sunday Takes 10,000 units on Monday wed and fri    . cyanocobalamin 1000 MCG tablet Take 1,000 mcg by mouth 2 (two) times a week.     Marland Kitchen MELATONIN PO Take 1 tablet by mouth at bedtime as needed (sleep).    Marland Kitchen VITAMIN E SKIN OIL Apply 1 application topically 2 (two) times daily.     No current facility-administered medications for this visit.      ALLERGIES: Chantix [varenicline]; Zyban [bupropion]; and Lamisil [terbinafine hcl]  Family History  Problem Relation Age of Onset  . Hyperlipidemia Mother   . Hypertension Mother   . Breast cancer Mother 54    chemo, radiation, mastectomy  . Hypertension Father   . Hyperlipidemia Father   .  Hyperlipidemia Sister   . Hypertension Sister   . Heart failure Paternal Grandmother     70's  . Heart failure Paternal Grandfather     23  . Prostate cancer Paternal Grandfather   . Hyperlipidemia Sister   . Hypertension Sister   . Hypertension Sister   . Hyperlipidemia Sister   . Colon cancer Maternal Grandmother     in her 71's    Social History   Social History  . Marital status: Single    Spouse name: N/A  . Number of children: N/A  . Years of education: N/A   Occupational History  . Not on file.   Social History Main Topics  . Smoking status: Current Every Day Smoker    Packs/day: 0.75    Years: 24.00    Types: Cigarettes  . Smokeless tobacco: Never Used  . Alcohol use No     Comment: occasional  . Drug use: No  . Sexual activity: No   Other Topics Concern  . Not on file    Social History Narrative  . No narrative on file    ROS:  Pertinent items are noted in HPI.  PHYSICAL EXAMINATION:    BP 122/70   Pulse 76   Ht  (1.676 m)   Wt 190 lb 12.8 oz (86.5 kg)   LMP 03/06/2017 (Exact Date)   BMI 30.80 kg/m     General appearance: alert, cooperative and appears stated age Head: Normocephalic, without obvious abnormality, atraumatic Neck: no adenopathy, supple, symmetrical, trachea midline and thyroid normal to inspection and palpation Lungs: clear to auscultation bilaterally Heart: regular rate and rhythm Abdomen: soft, non-tender, no masses,  no organomegaly Extremities: extremities normal, atraumatic, no cyanosis or edema Skin: Skin color, texture, turgor normal. No rashes or lesions No abnormal inguinal nodes palpated Neurologic: Grossly normal  Pelvic:  Deferred.   ASSESSMENT  Metrorrhagia.  Endometrial mass, polyp suspected. Smoker.  FH of breast cancer. Breast lift in Nov. 2017.   PLAN  Proceed with hysteroscopy with Myosure, dilation and curettage. Risks, benefits, alternatives discussed with patient who wishes to proceed.  Recommended smoking cessation.  She will do mammogram yearly.    An After Visit Summary was printed and given to the patient.

## 2017-03-12 NOTE — Telephone Encounter (Signed)
Patient is having surgery and was told to stop taking vitamin E prior to surgery. Patient is wondering if she needs to stop topical vitamin E?

## 2017-03-12 NOTE — Telephone Encounter (Signed)
Spoke with patient. Advised she will need to discontinue topical Vitamin E before surgery along with any oral vitamins or herbal medications. Patient verbalizes understanding. Asking if she can still [place] lotion for scars that she has prior to surgery. Advised she may use lotion just cannot have lotion on her skin the day of surgery. Patient is agreeable.  Routing to provider for final review. Patient agreeable to disposition. Will close encounter.

## 2017-03-13 ENCOUNTER — Encounter: Payer: Self-pay | Admitting: Obstetrics and Gynecology

## 2017-03-13 ENCOUNTER — Ambulatory Visit (INDEPENDENT_AMBULATORY_CARE_PROVIDER_SITE_OTHER): Payer: 59 | Admitting: Obstetrics and Gynecology

## 2017-03-13 VITALS — BP 122/70 | HR 76 | Ht 66.0 in | Wt 190.8 lb

## 2017-03-13 DIAGNOSIS — N921 Excessive and frequent menstruation with irregular cycle: Secondary | ICD-10-CM

## 2017-03-13 DIAGNOSIS — N9489 Other specified conditions associated with female genital organs and menstrual cycle: Secondary | ICD-10-CM

## 2017-03-15 NOTE — H&P (Signed)
Office Visit   03/13/2017 [facility]  Hemi Chacko E Ardell Isaacs, MD  Obstetrics and Gynecology   Metrorrhagia +1 more  Dx   Surgical Consult   Reason for Visit   Additional Documentation   Vitals:   BP 122/70   Pulse 76   Ht  (1.676 m)   Wt 190 lb 12.8 oz (86.5 kg)   LMP 03/06/2017 (Exact Date)   BMI 30.80 kg/m   BSA 2.01 m   Flowsheets:   Custom Formula Data,   MEWS Score,   Anthropometrics,   Infectious Disease Screening     Encounter Info:   Billing Info,   History,   Allergies,   Detailed Report     All Notes   Progress Notes by [name], MD at 03/13/2017 10:30 AM   Author: [name], MD Author Type: Physician Filed: 03/13/2017 11:23 AM  Note Status: Signed Cosign: Cosign Not Required Encounter Date: 03/13/2017  Editor: [name], MD (Physician)  Prior Versions: 1. Alphonsa Overall, CMA (Certified Medical Assistant) at 03/13/2017 10:44 AM - Sign at close encounter    GYNECOLOGY  VISIT   HPI: 41 y.o.   Single  Asian/Caucasian female   [id number] with Patient's last menstrual period was 03/06/2017 (exact date).   here for surgical consult.   Started abnormal uterine bleeding a few months prior to her first visit in Nov. 2016. Menses were occurring every 2 weeks.  Had a period of time where she skipped her cycles. Took course of Provera and menses regulated.  4 months ago, had menses occur more closelyagain, every 2 - 3 weeks.  No pain with menses.  Weight loss of 120 pounds over the last 2 years prompted by severe reflux. The last year, it has been very stable overall.   Has had hair loss.   Normal prolactin and TSH.   Pelvic ultrasound: No myometrial masses.  EMS 8.21 mm with vascular flow.  Ovaries normal. No free fluid.  Sonohysterogram shoed 12 x 18 mm filling defect.  EMB showed polypoid fragments of secretory endometrium.    GYNECOLOGIC HISTORY: Patient's last  menstrual period was 03/06/2017 (exact date). Contraception: None--same sex partner Menopausal hormone therapy:  n/a Last mammogram: 11-03-15 Density C/Neg/BiRads1:TBC  Last pap smear: 10-15-15 Neg:Neg HR HPV;1995 Neg.                  OB History    Gravida Para Term Preterm AB Living   0 0 0 0 0 0   SAB TAB Ectopic Multiple Live Births   0 0 0 0 0         There are no active problems to display for this patient.       Past Medical History:  Diagnosis Date  . Allergy   . Anemia    not recent  . Anxiety   . Arthritis   . Asthma   . GERD (gastroesophageal reflux disease)    no issues since weigh loss  . Pneumonia 1997  . PONV (postoperative nausea and vomiting)          Past Surgical History:  Procedure Laterality Date  . arm lift  09/2016  . breast lift  09/2016  . BREAST SURGERY    . FINGER SURGERY Right age 11   thumb  . MASTOPEXY Bilateral 09/27/2016   she also had bilateral bronchioplasty at same time.  . WISDOM TOOTH EXTRACTION  Current Outpatient Prescriptions  Medication Sig Dispense Refill  . albuterol (PROVENTIL HFA;VENTOLIN HFA) 108 (90 Base) MCG/ACT inhaler Inhale 1-2 puffs into the lungs every 6 (six) hours as needed for wheezing or shortness of breath.    . Biotin 5 MG TABS Take 5 mg by mouth daily.    Marland Kitchen BIOTIN PO Take 1 tablet by mouth.    . Cholecalciferol (VITAMIN D3) 5000 units CAPS Take 5,000-10,000 Units by mouth See admin instructions. Takes 5000 units on tues thurs saturday and   . Prostate cancer Paternal Grandfather   . Hyperlipidemia Sister   . Hypertension Sister   . Hypertension Sister   . Hyperlipidemia Sister   . Colon cancer Maternal Grandmother     in her 32's    Social History        Social History  . Marital status: Single    Spouse name: N/A  . Number of children: N/A  . Years of education: N/A      Occupational History  . Not on file.         Social History Main Topics  . Smoking status: Current Every Day Smoker    Packs/day: 0.75    Years: 24.00    Types: Cigarettes  . Smokeless tobacco: Never Used  . Alcohol use No     Comment: occasional  . Drug use: No  . Sexual activity: No       Other Topics Concern  . Not on file      Social History Narrative  . No narrative on file    ROS:  Pertinent items are noted in HPI.  PHYSICAL EXAMINATION:    BP 122/70   Pulse 76   Ht  (1.676 m)   Wt 190 lb 12.8 oz (86.5 kg)   LMP 03/06/2017 (Exact Date)   BMI 30.80 kg/m     General appearance: alert, cooperative and appears stated age Head: Normocephalic, without obvious abnormality, atraumatic Neck: no adenopathy, supple, symmetrical, trachea midline and thyroid normal to inspection and palpation Lungs: clear to auscultation bilaterally Heart: regular rate and rhythm Abdomen: soft, non-tender, no masses,  no organomegaly Extremities: extremities normal, atraumatic, no cyanosis or edema Skin: Skin color, texture, turgor normal. No rashes or lesions No abnormal inguinal nodes palpated Neurologic: Grossly  normal  Pelvic:  Deferred.   ASSESSMENT  Metrorrhagia.  Endometrial mass, polyp suspected. Smoker.  FH of breast cancer. Breast lift in Nov. 2017.   PLAN  Proceed with hysteroscopy with Myosure, dilation and curettage. Risks, benefits, alternatives discussed  with patient who wishes to proceed.  Recommended smoking cessation.  She will do mammogram yearly.    An After Visit Summary was printed and given to the patient.

## 2017-03-16 ENCOUNTER — Encounter (HOSPITAL_COMMUNITY)
Admission: RE | Disposition: A | Discharge status: Home / Self Care | Payer: Self-pay | Source: Ambulatory Visit | Attending: Obstetrics and Gynecology

## 2017-03-16 ENCOUNTER — Encounter (HOSPITAL_COMMUNITY): Payer: Self-pay

## 2017-03-16 ENCOUNTER — Ambulatory Visit (HOSPITAL_COMMUNITY): Payer: 59 | Admitting: Anesthesiology

## 2017-03-16 ENCOUNTER — Ambulatory Visit (HOSPITAL_COMMUNITY)
Admission: RE | Admit: 2017-03-16 | Discharge: 2017-03-16 | Disposition: A | Discharge status: Home / Self Care | Payer: 59 | Source: Ambulatory Visit | Attending: Obstetrics and Gynecology | Admitting: Obstetrics and Gynecology

## 2017-03-16 DIAGNOSIS — Z79899 Other long term (current) drug therapy: Secondary | ICD-10-CM | POA: Diagnosis not present

## 2017-03-16 DIAGNOSIS — Z803 Family history of malignant neoplasm of breast: Secondary | ICD-10-CM | POA: Diagnosis not present

## 2017-03-16 DIAGNOSIS — N858 Other specified noninflammatory disorders of uterus: Secondary | ICD-10-CM | POA: Diagnosis not present

## 2017-03-16 DIAGNOSIS — N921 Excessive and frequent menstruation with irregular cycle: Secondary | ICD-10-CM | POA: Insufficient documentation

## 2017-03-16 DIAGNOSIS — F17210 Nicotine dependence, cigarettes, uncomplicated: Secondary | ICD-10-CM | POA: Diagnosis not present

## 2017-03-16 DIAGNOSIS — N840 Polyp of corpus uteri: Secondary | ICD-10-CM | POA: Insufficient documentation

## 2017-03-16 DIAGNOSIS — F419 Anxiety disorder, unspecified: Secondary | ICD-10-CM | POA: Insufficient documentation

## 2017-03-16 DIAGNOSIS — N939 Abnormal uterine and vaginal bleeding, unspecified: Secondary | ICD-10-CM | POA: Diagnosis not present

## 2017-03-16 DIAGNOSIS — K219 Gastro-esophageal reflux disease without esophagitis: Secondary | ICD-10-CM | POA: Diagnosis not present

## 2017-03-16 DIAGNOSIS — J45909 Unspecified asthma, uncomplicated: Secondary | ICD-10-CM | POA: Diagnosis not present

## 2017-03-16 HISTORY — PX: DILATATION & CURETTAGE/HYSTEROSCOPY WITH MYOSURE: SHX6511

## 2017-03-16 HISTORY — DX: Other specified postprocedural states: Z98.890

## 2017-03-16 HISTORY — DX: Gastro-esophageal reflux disease without esophagitis: K21.9

## 2017-03-16 HISTORY — DX: Pneumonia, unspecified organism: J18.9

## 2017-03-16 HISTORY — DX: Other specified postprocedural states: R11.2

## 2017-03-16 HISTORY — DX: Anemia, unspecified: D64.9

## 2017-03-16 LAB — CBC
HEMATOCRIT: 40.9 % (ref 36.0–46.0)
HEMOGLOBIN: 13.2 g/dL (ref 12.0–15.0)
MCH: 27.7 pg (ref 26.0–34.0)
MCHC: 32.3 g/dL (ref 30.0–36.0)
MCV: 85.9 fL (ref 78.0–100.0)
Platelets: 271 10*3/uL (ref 150–400)
RBC: 4.76 MIL/uL (ref 3.87–5.11)
RDW: 16.3 % — AB (ref 11.5–15.5)
WBC: 7.6 10*3/uL (ref 4.0–10.5)

## 2017-03-16 LAB — BASIC METABOLIC PANEL
ANION GAP: 6 (ref 5–15)
BUN: 18 mg/dL (ref 6–20)
CALCIUM: 8.6 mg/dL — AB (ref 8.9–10.3)
CHLORIDE: 103 mmol/L (ref 101–111)
CO2: 27 mmol/L (ref 22–32)
CREATININE: 0.64 mg/dL (ref 0.44–1.00)
GFR calc Af Amer: >60 mL/min (ref >60)
GFR calc non Af Amer: >60 mL/min (ref >60)
GLUCOSE: 94 mg/dL (ref 65–99)
Potassium: 3.8 mmol/L (ref 3.5–5.1)
Sodium: 136 mmol/L (ref 135–145)

## 2017-03-16 LAB — PREGNANCY, URINE: Preg Test, Ur: NEGATIVE

## 2017-03-16 SURGERY — DILATATION & CURETTAGE/HYSTEROSCOPY WITH MYOSURE
Anesthesia: General | Site: Vagina

## 2017-03-16 MED ORDER — GLYCOPYRROLATE 0.2 MG/ML IJ SOLN
INTRAMUSCULAR | Status: DC | PRN
  Administered 2017-03-16: .2 mg via INTRAVENOUS

## 2017-03-16 MED ORDER — LIDOCAINE HCL (CARDIAC) 20 MG/ML IV SOLN
INTRAVENOUS | Status: DC | PRN
  Administered 2017-03-16: 100 mg via INTRAVENOUS

## 2017-03-16 MED ORDER — DEXAMETHASONE SODIUM PHOSPHATE 10 MG/ML IJ SOLN
INTRAMUSCULAR | Status: DC | PRN
  Administered 2017-03-16: 10 mg via INTRAVENOUS

## 2017-03-16 MED ORDER — SCOPOLAMINE 1 MG/3DAYS TD PT72
1.0000 | MEDICATED_PATCH | Freq: Once | TRANSDERMAL | Status: DC
  Administered 2017-03-16: 1.5 mg via TRANSDERMAL

## 2017-03-16 MED ORDER — ONDANSETRON HCL 4 MG/2ML IJ SOLN
INTRAMUSCULAR | Status: DC | PRN
  Administered 2017-03-16: 4 mg via INTRAVENOUS

## 2017-03-16 MED ORDER — LACTATED RINGERS IV SOLN: INTRAVENOUS | Status: DC

## 2017-03-16 MED ORDER — KETOROLAC TROMETHAMINE 30 MG/ML IJ SOLN
INTRAMUSCULAR | Status: DC | PRN
  Administered 2017-03-16: 30 mg via INTRAVENOUS

## 2017-03-16 MED ORDER — PROPOFOL 10 MG/ML IV BOLUS
INTRAVENOUS | Status: AC
  Filled 2017-03-16: qty 20

## 2017-03-16 MED ORDER — PROPOFOL 10 MG/ML IV BOLUS
INTRAVENOUS | Status: DC | PRN
  Administered 2017-03-16: 250 mg via INTRAVENOUS

## 2017-03-16 MED ORDER — MIDAZOLAM HCL 2 MG/2ML IJ SOLN
INTRAMUSCULAR | Status: AC
  Filled 2017-03-16: qty 2

## 2017-03-16 MED ORDER — FENTANYL CITRATE (PF) 100 MCG/2ML IJ SOLN
INTRAMUSCULAR | Status: AC
  Filled 2017-03-16: qty 2

## 2017-03-16 MED ORDER — DIPHENHYDRAMINE HCL 50 MG/ML IJ SOLN
INTRAMUSCULAR | Status: DC | PRN
  Administered 2017-03-16: 25 mg via INTRAVENOUS

## 2017-03-16 MED ORDER — MIDAZOLAM HCL 2 MG/2ML IJ SOLN
INTRAMUSCULAR | Status: DC | PRN
  Administered 2017-03-16: 2 mg via INTRAVENOUS

## 2017-03-16 MED ORDER — LIDOCAINE HCL (CARDIAC) 20 MG/ML IV SOLN
INTRAVENOUS | Status: AC
  Filled 2017-03-16: qty 5

## 2017-03-16 MED ORDER — LIDOCAINE HCL 1 % IJ SOLN
INTRAMUSCULAR | Status: AC
  Filled 2017-03-16: qty 20

## 2017-03-16 MED ORDER — ONDANSETRON HCL 4 MG/2ML IJ SOLN
INTRAMUSCULAR | Status: AC
  Filled 2017-03-16: qty 2

## 2017-03-16 MED ORDER — DEXAMETHASONE SODIUM PHOSPHATE 10 MG/ML IJ SOLN
INTRAMUSCULAR | Status: AC
Start: 2017-03-16 — End: 2017-03-16
  Filled 2017-03-16: qty 1

## 2017-03-16 MED ORDER — FENTANYL CITRATE (PF) 100 MCG/2ML IJ SOLN
INTRAMUSCULAR | Status: DC | PRN
Start: 2017-03-16 — End: 2017-03-16
  Administered 2017-03-16 (×2): 50 ug via INTRAVENOUS

## 2017-03-16 MED ORDER — KETOROLAC TROMETHAMINE 30 MG/ML IJ SOLN
INTRAMUSCULAR | Status: AC
  Filled 2017-03-16: qty 1

## 2017-03-16 MED ORDER — DIPHENHYDRAMINE HCL 50 MG/ML IJ SOLN
INTRAMUSCULAR | Status: AC
  Filled 2017-03-16: qty 1

## 2017-03-16 MED ORDER — LACTATED RINGERS IV SOLN
INTRAVENOUS | Status: DC
  Administered 2017-03-16 (×2): via INTRAVENOUS

## 2017-03-16 MED ORDER — SODIUM CHLORIDE 0.9 % IR SOLN
Status: DC | PRN
  Administered 2017-03-16: 3000 mL

## 2017-03-16 MED ORDER — GLYCOPYRROLATE 0.2 MG/ML IJ SOLN
INTRAMUSCULAR | Status: AC
Start: 2017-03-16 — End: 2017-03-16
  Filled 2017-03-16: qty 1

## 2017-03-16 MED ORDER — SCOPOLAMINE 1 MG/3DAYS TD PT72
MEDICATED_PATCH | TRANSDERMAL | Status: AC
  Administered 2017-03-16: 1.5 mg via TRANSDERMAL
  Filled 2017-03-16: qty 1

## 2017-03-16 MED ORDER — LIDOCAINE HCL 1 % IJ SOLN
INTRAMUSCULAR | Status: DC | PRN
  Administered 2017-03-16: 10 mL

## 2017-03-16 SURGICAL SUPPLY — 19 items
CANISTER SUCT 3000ML PPV (MISCELLANEOUS) ×2 IMPLANT
CATH ROBINSON RED A/P 16FR (CATHETERS) ×2 IMPLANT
CLOTH BEACON ORANGE TIMEOUT ST (SAFETY) ×2 IMPLANT
CONTAINER PREFILL 10% NBF 60ML (FORM) ×4 IMPLANT
DEVICE MYOSURE LITE (MISCELLANEOUS) IMPLANT
DEVICE MYOSURE REACH (MISCELLANEOUS) IMPLANT
ELECT REM PT RETURN 9FT ADLT (ELECTROSURGICAL)
ELECTRODE REM PT RTRN 9FT ADLT (ELECTROSURGICAL) IMPLANT
FILTER ARTHROSCOPY CONVERTOR (FILTER) ×2 IMPLANT
GLOVE BIO SURGEON STRL SZ 6.5 (GLOVE) ×2 IMPLANT
GLOVE BIOGEL PI IND STRL 7.0 (GLOVE) ×2 IMPLANT
GLOVE BIOGEL PI INDICATOR 7.0 (GLOVE) ×2
GOWN STRL REUS W/TWL LRG LVL3 (GOWN DISPOSABLE) ×4 IMPLANT
PACK VAGINAL MINOR WOMEN LF (CUSTOM PROCEDURE TRAY) ×2 IMPLANT
PAD OB MATERNITY 4.3X12.25 (PERSONAL CARE ITEMS) ×2 IMPLANT
SEAL ROD LENS SCOPE MYOSURE (ABLATOR) ×2 IMPLANT
TOWEL OR 17X24 6PK STRL BLUE (TOWEL DISPOSABLE) ×4 IMPLANT
TUBING AQUILEX INFLOW (TUBING) ×2 IMPLANT
TUBING AQUILEX OUTFLOW (TUBING) ×2 IMPLANT

## 2017-03-16 NOTE — Op Note (Signed)
OPERATIVE REPORT   PREOPERATIVE DIAGNOSES:   Metrorrhagia, endometrial polyp.  POSTOPERATIVE DIAGNOSES:   Metrorrhagia, thickened endometrium.   PROCEDURE:  Hysteroscopy with dilation and curettage.  SURGEON:  Randye Lobo, MD  ANESTHESIA:  LMA, paracervical block with 10 mL of 1% lidocaine.  IV FLUIDS:     EBL:  10 cc.   URINE OUTPUT: 60 cc.   NORMAL SALINE DEFICIT:   100 cc.  COMPLICATIONS:  None.  INDICATIONS FOR THE PROCEDURE:     The patient is a 41 year old Caucasian female with metrorrhagia.  Office sonohysterogram showed a fundal defect measuring 12 x 18 mm, and an endometrial biopsy showed polypoid secretory endometrium.   A plan is now made to proceed with a hysteroscopy with dilation and curettage and Myosure resection of endometrial polyp.; after risks, benefits and alternatives were reviewed.  FINDINGS:  Exam under anesthesia revealed a small anteverted, mobile uterus. The uterus was sounded to 7 cm. Hysteroscopy showed generally thickened endometrium. Endometrial currettings were moderate in amount.   SPECIMENS:  Endometrial curettings were sent to Pathology.  PROCEDURE IN DETAIL:  The patient was reidentified in the preoperative hold area.  She received TED hose and PAS stockings for DVT prophylaxis.  In the operating room, the patient was placed in the dorsal lithotomy position and then an LMA anesthetic was introduced.  The patient's lower abdomen, vagina and perineum were sterilely prepped with Betadine and the  patient's bladder was catheterized of urine.  She was sterilly draped  An exam under anesthesia was performed.  A speculum was placed inside the vagina and a single-tooth tenaculum was placed on the anterior cervical lip.  A paracervical block was performed with a total of 10 mL of 1% lidocaine plain.  The uterus was sounded. The cervix was dilated to a #23 Pratt dilator.  The MyoSure hysteroscope was then  inserted inside the uterine cavity under the continuous infusion of normal saline solution.  Findings are as noted above.  The MyoSure hysteroscope was removed.  The cervix was further dilated to a #25 Pratt dilator.  The sharp and then a serrated curette were introduced into the uterine cavity and the endometrium was curetted in all 4 quadrants.  A moderate amount of endometrial curettings was obtained.  This specimen was sent to Pathology.  The single-tooth tenaculum which had been placed on the anterior cervical lip was removed.  Hemostasis was good, and all of the vaginal  instruments were removed.  The patient was awakened and escorted to the recovery room in stable condition after she was cleansed with Betadine.  There were no complications to the procedure.  All needle, instrument and sponge counts were correct.  Randye Lobo, MD

## 2017-03-16 NOTE — Anesthesia Procedure Notes (Signed)
Procedure Name: LMA Insertion Date/Time: 03/16/2017 9:47 AM Performed by: Heather Roberts Pre-anesthesia Checklist: Patient identified, Patient being monitored, Emergency Drugs available, Timeout performed and Suction available Patient Re-evaluated:Patient Re-evaluated prior to inductionOxygen Delivery Method: Circle System Utilized Preoxygenation: Pre-oxygenation with 100% oxygen Intubation Type: IV induction Ventilation: Mask ventilation without difficulty LMA: LMA inserted LMA Size: 4.0 Number of attempts: 1 Placement Confirmation: positive ETCO2 and breath sounds checked- equal and bilateral

## 2017-03-16 NOTE — Progress Notes (Signed)
Update to History and Physical  No marked change in status since office preop visit.  Patient examined.  Ok to proceed with surgery.  

## 2017-03-16 NOTE — Brief Op Note (Addendum)
03/16/2017  10:13 AM  PATIENT:  Kayla Walsh  41 y.o. female  PRE-OPERATIVE DIAGNOSIS:  metrorrhagia, endometrial polyp  POST-OPERATIVE DIAGNOSIS:  metrorrhagia, thickened endometrium  PROCEDURE:  Procedure(s): DILATATION & CURETTAGE/HYSTEROSCCOPY (N/A)  SURGEON:  Surgeon(s) and Role:    * Brook E Ardell Isaacs, MD - Primary  PHYSICIAN ASSISTANT: NA  ASSISTANTS: none   ANESTHESIA:   paracervical block and LMA  EBL:  Total I/O In: -  Out: 60 [Urine:60]  BLOOD ADMINISTERED:none  DRAINS:  None.  LOCAL MEDICATIONS USED:  LIDOCAINE  and Amount:  10 ml  SPECIMEN:  Source of Specimen:  endometrial curettings.  DISPOSITION OF SPECIMEN:  PATHOLOGY  COUNTS:  YES  TOURNIQUET:  * No tourniquets in log *  DICTATION: .Note written in EPIC  PLAN OF CARE: Discharge to home after PACU  PATIENT DISPOSITION:  PACU - hemodynamically stable.   Delay start of Pharmacological VTE agent (>24hrs) due to surgical blood loss or risk of bleeding: not applicable

## 2017-03-16 NOTE — Anesthesia Postprocedure Evaluation (Addendum)
Anesthesia Post Note  Patient: Kayla Walsh  Procedure(s) Performed: Procedure(s) (LRB): DILATATION & CURETTAGE/HYSTEROSCCOPY (N/A)  Patient location during evaluation: PACU Anesthesia Type: General Level of consciousness: sedated Pain management: pain level controlled Vital Signs Assessment: post-procedure vital signs reviewed and stable Respiratory status: spontaneous breathing and respiratory function stable Cardiovascular status: stable Anesthetic complications: no        Last Vitals:  Vitals:   03/16/17 1130 03/16/17 1141  BP:  106/80  Pulse: 60 64  Resp: 14 14  Temp:  36.4 C    Last Pain:  Vitals:   03/16/17 0850  TempSrc: Oral   Pain Goal: Patients Stated Pain Goal: 3 (03/16/17 1023)               Reynol Arnone DANIEL

## 2017-03-16 NOTE — Discharge Instructions (Signed)
Kayla Walsh,   I found that the entire lining of the uterine cavity, called the endometrium, was thickened.  There was no one specific area of polyp.  I thinned the lining down with the curettage, and all of the specimen will be send to the lab for analysis.  The office will let you know when this report is back.  Call if you need anything.   I will see you in the office in 2 weeks.   Conley Simmonds, MD   Post Anesthesia Home Care Instructions  Activity: Get plenty of rest for the remainder of the day. A responsible individual must stay with you for 24 hours following the procedure.  For the next 24 hours, DO NOT: -Drive a car -Advertising copywriter -Drink alcoholic beverages -Take any medication unless instructed by your physician -Make any legal decisions or sign important papers.  Meals: Start with liquid foods such as gelatin or soup. Progress to regular foods as tolerated. Avoid greasy, spicy, heavy foods. If nausea and/or vomiting occur, drink only clear liquids until the nausea and/or vomiting subsides. Call your physician if vomiting continues.  Special Instructions/Symptoms: Your throat may feel dry or sore from the anesthesia or the breathing tube placed in your throat during surgery. If this causes discomfort, gargle with warm salt water. The discomfort should disappear within 24 hours.  If you had a scopolamine patch placed behind your ear for the management of post- operative nausea and/or vomiting:  1. The medication in the patch is effective for 72 hours, after which it should be removed.  Wrap patch in a tissue and discard in the trash. Wash hands thoroughly with soap and water. 2. You may remove the patch earlier than 72 hours if you experience unpleasant side effects which may include dry mouth, dizziness or visual disturbances. 3. Avoid touching the patch. Wash your hands with soap and water after contact with the patch.   Hysteroscopy, Care After Refer to this sheet in  the next few weeks. These instructions provide you with information on caring for yourself after your procedure. Your health care provider may also give you more specific instructions. Your treatment has been planned according to current medical practices, but problems sometimes occur. Call your health care provider if you have any problems or questions after your procedure. What can I expect after the procedure? After your procedure, it is typical to have the following:  You may have some cramping. This normally lasts for a couple days.  You may have bleeding. This can vary from light spotting for a few days to menstrual-like bleeding for 3-7 days. Follow these instructions at home:  Rest for the first 1-2 days after the procedure.  Only take over-the-counter or prescription medicines as directed by your health care provider. Do not take aspirin. It can increase the chances of bleeding.  Take showers instead of baths for 2 weeks or as directed by your health care provider.  Do not drive for 24 hours or as directed.  Do not drink alcohol while taking pain medicine.  Do not use tampons, douche, or have sexual intercourse for 2 weeks or until your health care provider says it is okay.  Take your temperature twice a day for 4-5 days. Write it down each time.  Follow your health care provider's advice about diet, exercise, and lifting.  If you develop constipation, you may:  Take a mild laxative if your health care provider approves.  Add bran foods to your diet.  Drink  enough fluids to keep your urine clear or pale yellow.  Try to have someone with you or available to you for the first 24-48 hours, especially if you were given a general anesthetic.  Follow up with your health care provider as directed. Contact a health care provider if:  You feel dizzy or lightheaded.  You feel sick to your stomach (nauseous).  You have abnormal vaginal discharge.  You have a rash.  You  have pain that is not controlled with medicine. Get help right away if:  You have bleeding that is heavier than a normal menstrual period.  You have a fever.  You have increasing cramps or pain, not controlled with medicine.  You have new belly (abdominal) pain.  You pass out.  You have pain in the tops of your shoulders (shoulder strap areas).  You have shortness of breath. This information is not intended to replace advice given to you by your health care provider. Make sure you discuss any questions you have with your health care provider. Document Released: 08/31/2013 Document Revised: 04/17/2016 Document Reviewed: 06/09/2013 Elsevier Interactive Patient Education  2017 [employer]   Dilation and Curettage or Vacuum Curettage, Care After This sheet gives you information about how to care for yourself after your procedure. Your health care provider may also give you more specific instructions. If you have problems or questions, contact your health care provider. What can I expect after the procedure? After your procedure, it is common to have:  Mild pain or cramping.  Some vaginal bleeding or spotting. These may last for up to 2 weeks after your procedure. Follow these instructions at home: Activity    Do not drive or use heavy machinery while taking prescription pain medicine.  Avoid driving for the first 24 hours after your procedure.  Take frequent, short walks, followed by rest periods, throughout the day. Ask your health care provider what activities are safe for you. After 1-2 days, you may be able to return to your normal activities.  Do not lift anything heavier than 10 lb (4.5 kg) until your health care provider approves.  For at least 2 weeks, or as long as told by your health care provider, do not:  Douche.  Use tampons.  Have sexual intercourse. General instructions    Take over-the-counter and prescription medicines only as told by your health  care provider. This is especially important if you take blood thinning medicine.  Do not take baths, swim, or use a hot tub until your health care provider approves. Take showers instead of baths.  Wear compression stockings as told by your health care provider. These stockings help to prevent blood clots and reduce swelling in your legs.  It is your responsibility to get the results of your procedure. Ask your health care provider, or the department performing the procedure, when your results will be ready.  Keep all follow-up visits as told by your health care provider. This is important. Contact a health care provider if:  You have severe cramps that get worse or that do not get better with medicine.  You have severe abdominal pain.  You cannot drink fluids without vomiting.  You develop pain in a different area of your pelvis.  You have bad-smelling vaginal discharge.  You have a rash. Get help right away if:  You have vaginal bleeding that soaks more than one sanitary pad in 1 hour, for 2 hours in a row.  You pass large blood clots from your  vagina.  You have a fever that is above 100.81F (38.0C).  Your abdomen feels very tender or hard.  You have chest pain.  You have shortness of breath.  You cough up blood.  You feel dizzy or light-headed.  You faint.  You have pain in your neck or shoulder area. This information is not intended to replace advice given to you by your health care provider. Make sure you discuss any questions you have with your health care provider. Document Released: 11/07/2000 Document Revised: 07/09/2016 Document Reviewed: 06/12/2016 Elsevier Interactive Patient Education  2017 [employer].

## 2017-03-16 NOTE — Anesthesia Preprocedure Evaluation (Signed)
Anesthesia Evaluation  Patient identified by MRN, date of birth, ID band Patient awake    Reviewed: Allergy & Precautions, NPO status , Patient's Chart, lab work & pertinent test results  History of Anesthesia Complications (+) PONVNegative for: history of anesthetic complications  Airway Mallampati: II  TM Distance: >3 FB Neck ROM: Full    Dental no notable dental hx. (+) Dental Advisory Given   Pulmonary asthma , Current Smoker,    Pulmonary exam normal        Cardiovascular negative cardio ROS Normal cardiovascular exam     Neuro/Psych Anxiety negative neurological ROS     GI/Hepatic Neg liver ROS, GERD  ,  Endo/Other  negative endocrine ROS  Renal/GU negative Renal ROS  negative genitourinary   Musculoskeletal negative musculoskeletal ROS (+)   Abdominal   Peds negative pediatric ROS (+)  Hematology negative hematology ROS (+)   Anesthesia Other Findings   Reproductive/Obstetrics negative OB ROS                             Anesthesia Physical Anesthesia Plan  ASA: II  Anesthesia Plan: General   Post-op Pain Management:    Induction: Intravenous  Airway Management Planned: LMA and Oral ETT  Additional Equipment:   Intra-op Plan:   Post-operative Plan: Extubation in OR  Informed Consent: I have reviewed the patients History and Physical, chart, labs and discussed the procedure including the risks, benefits and alternatives for the proposed anesthesia with the patient or authorized representative who has indicated his/her understanding and acceptance.   Dental advisory given  Plan Discussed with: CRNA and Anesthesiologist  Anesthesia Plan Comments:         Anesthesia Quick Evaluation

## 2017-03-16 NOTE — Transfer of Care (Signed)
Immediate Anesthesia Transfer of Care Note  Patient: Kayla Walsh  Procedure(s) Performed: Procedure(s): DILATATION & CURETTAGE/HYSTEROSCCOPY (N/A)  Patient Location: PACU  Anesthesia Type:General  Level of Consciousness: awake, alert  and oriented  Airway & Oxygen Therapy: Patient Spontanous Breathing and Patient connected to nasal cannula oxygen  Post-op Assessment: Report given to RN, Post -op Vital signs reviewed and stable and Patient moving all extremities X 4  Post vital signs: Reviewed and stable  Last Vitals:  Vitals:   03/16/17 0850  BP: 122/83  Pulse: (!) 58  Resp: 18  Temp: 36.3 C    Last Pain:  Vitals:   03/16/17 0850  TempSrc: Oral      Patients Stated Pain Goal: 3 (03/16/17 0850)  Complications: No apparent anesthesia complications

## 2017-03-17 ENCOUNTER — Encounter (HOSPITAL_COMMUNITY): Payer: Self-pay | Admitting: Obstetrics and Gynecology

## 2017-03-18 ENCOUNTER — Telehealth: Payer: Self-pay | Admitting: *Deleted

## 2017-03-18 NOTE — Telephone Encounter (Signed)
Spoke with patient, advised of results as seen below per Dr. Silva. Patient verbalizes understanding and is agreeable.  Routing to provider for final review. Patient is agreeable to disposition. Will close encounter.  

## 2017-03-18 NOTE — Telephone Encounter (Signed)
-----   Message from [name], MD sent at 03/17/2017  7:32 PM EDT ----- Please report final surgical pathology to patient showing endometrial polyp and no evidence of malignancy.   I will see her for her 2 week post op visit.   I hope she is doing well following her surgery this week!

## 2017-03-18 NOTE — Telephone Encounter (Signed)
Left message to call Meggin Ola at [phone].  

## 2017-03-27 NOTE — Progress Notes (Signed)
GYNECOLOGY  VISIT   HPI: 41 y.o.   Single  Asian/Caucasian  female   G0P0000 with Patient's last menstrual period was 03/29/2017 (exact date).   here for 2 week follow up DILATATION & CURETTAGE/HYSTEROSCCOPY (N/A Vagina ).   Spotted for 5 days following surgery.   Findings at surgery were thickened endometrium.  Final pathology showed benign endometrial polyp.  Just started her period last night.   Declines medical therapy at this time for cycle regulation.    GYNECOLOGIC HISTORY: Patient's last menstrual period was 03/29/2017 (exact date). Contraception: None--same sex partner Menopausal hormone therapy:  n/a Last mammogram:  11-03-15 Density C/Neg/BiRads1:TBC  Last pap smear: 10-15-15 Neg:Neg HR HPV;1995 Neg.          OB History    Gravida Para Term Preterm AB Living   0 0 0 0 0 0   SAB TAB Ectopic Multiple Live Births   0 0 0 0 0         There are no active problems to display for this patient.   Past Medical History:  Diagnosis Date  . Allergy   . Anemia    not recent  . Anxiety   . Arthritis   . Asthma   . GERD (gastroesophageal reflux disease)    no issues since weigh loss  . Pneumonia 1997  . PONV (postoperative nausea and vomiting)     Past Surgical History:  Procedure Laterality Date  . arm lift  09/2016  . breast lift  09/2016  . BREAST SURGERY    . DILATATION & CURETTAGE/HYSTEROSCOPY WITH MYOSURE N/A 03/16/2017   Procedure: DILATATION & CURETTAGE/HYSTEROSCCOPY;  Surgeon: Patton SallesBrook E Amundson C Silva, MD;  Location: [facility];  Service: Gynecology;  Laterality: N/A;  . FINGER SURGERY Right age 41   thumb  . MASTOPEXY Bilateral 09/27/2016   she also had bilateral bronchioplasty at same time.  . WISDOM TOOTH EXTRACTION      Current Outpatient Prescriptions  Medication Sig Dispense Refill  . albuterol (PROVENTIL HFA;VENTOLIN HFA) 108 (90 Base) MCG/ACT inhaler Inhale 1-2 puffs into the lungs every 6 (six) hours as needed for wheezing or shortness of  breath.    . Biotin 5 MG TABS Take 5 mg by mouth daily.    Marland Kitchen. BIOTIN PO Take 1 tablet by mouth.    . Cholecalciferol (VITAMIN D3) 5000 units CAPS Take 5,000-10,000 Units by mouth See admin instructions. Takes 5000 units on tues thurs saturday and Sunday Takes 10,000 units on Monday wed and fri    . cyanocobalamin 1000 MCG tablet Take 1,000 mcg by mouth 2 (two) times a week.     . ibuprofen (ADVIL,MOTRIN) 400 MG tablet Take 400 mg by mouth every 6 (six) hours as needed.    . MELATONIN PO Take 1 tablet by mouth at bedtime as needed (sleep).    . VITAMIN E SKIN OIL Apply 1 application topically 2 (two) times daily.     No current facility-administered medications for this visit.      ALLERGIES: Chantix [varenicline]; Zyban [bupropion]; and Lamisil [terbinafine hcl]  Family History  Problem Relation Age of Onset  . Hyperlipidemia Mother   . Hypertension Mother   . Breast cancer Mother 49    chemo, radiation, mastectomy  . Hypertension Father   . Hyperlipidemia Father   . Hyperlipidemia Sister   . Hypertension Sister   . Heart failure Paternal Grandmother     70 's  . Heart failure Paternal Grandfather  81  . Prostate cancer Paternal Grandfather   . Hyperlipidemia Sister   . Hypertension Sister   . Hypertension Sister   . Hyperlipidemia Sister   . Colon cancer Maternal Grandmother     in her 80's    Social History   Social History  . Marital status: Single    Spouse name: N/A  . Number of children: N/A  . Years of education: N/A   Occupational History  . Not on file.   Social History Main Topics  . Smoking status: Current Every Day Smoker    Packs/day: 0.75    Years: 24.00    Types: Cigarettes  . Smokeless tobacco: Never Used  . Alcohol use No     Comment: occasional  . Drug use: No  . Sexual activity: No   Other Topics Concern  . Not on file   Social History Narrative  . No narrative on file    ROS:  Pertinent items are noted in HPI.  PHYSICAL  EXAMINATION:    BP 110/62 (BP Location: Right Arm, Patient Position: Sitting, Cuff Size: Normal)   Pulse 60   Ht 5\' 6"  (1.676 m)   Wt 194 lb 6.4 oz (88.2 kg)   LMP 03/29/2017 (Exact Date)   BMI 31.38 kg/m     General appearance: alert, cooperative and appears stated age   Pelvic: External genitalia:  no lesions              Urethra:  normal appearing urethra with no masses, tenderness or lesions              Bartholins and Skenes: normal                 Vagina: normal appearing vagina with normal color and discharge, no lesions              Cervix: no lesions                Bimanual Exam:  Uterus:  normal size, contour, position, consistency, mobility, non-tender              Adnexa: no mass, fullness, tenderness             Chaperone was present for exam.  ASSESSMENT  Status post hysteroscopy with dilation and curettage. Benign endometrial polyp.  Status post significant intentional weight loss.   PLAN  Surgical care, findings, and pathology report discussed.  Will monitor her cycles and report back when she returns for her next visit.  We discussed that her weight loss was an excellent way to reduce risk of endometrial hyperplasia and even endometrial cancer.  Annual exam and mammogram in about 3 months with Shirlyn Goltz.  Return for any concerns.    An After Visit Summary was printed and given to the patient.

## 2017-03-30 ENCOUNTER — Ambulatory Visit (INDEPENDENT_AMBULATORY_CARE_PROVIDER_SITE_OTHER): Payer: 59 | Admitting: Obstetrics and Gynecology

## 2017-03-30 VITALS — BP 110/62 | HR 60 | Ht 66.0 in | Wt 194.4 lb

## 2017-03-30 DIAGNOSIS — Z9889 Other specified postprocedural states: Secondary | ICD-10-CM

## 2017-03-31 ENCOUNTER — Encounter: Payer: Self-pay | Admitting: Obstetrics and Gynecology

## 2017-04-25 NOTE — Addendum Note (Signed)
Addendum  created 04/25/17 0833 by Dura Mccormack, MD   Sign clinical note    

## 2017-06-02 ENCOUNTER — Telehealth: Payer: Self-pay | Admitting: Obstetrics & Gynecology

## 2017-06-02 NOTE — Telephone Encounter (Signed)
Left message regarding upcoming appointment has been canceled and needs to be rescheduled. °

## 2017-07-06 ENCOUNTER — Ambulatory Visit: Payer: 59 | Admitting: Nurse Practitioner

## 2018-04-01 ENCOUNTER — Other Ambulatory Visit (HOSPITAL_COMMUNITY)
Admission: RE | Admit: 2018-04-01 | Discharge: 2018-04-01 | Disposition: A | Discharge status: Home / Self Care | Payer: 59 | Source: Ambulatory Visit | Attending: Obstetrics & Gynecology | Admitting: Obstetrics & Gynecology

## 2018-04-01 ENCOUNTER — Ambulatory Visit (INDEPENDENT_AMBULATORY_CARE_PROVIDER_SITE_OTHER): Payer: 59 | Admitting: Certified Nurse Midwife

## 2018-04-01 ENCOUNTER — Other Ambulatory Visit: Payer: Self-pay

## 2018-04-01 ENCOUNTER — Encounter: Payer: Self-pay | Admitting: Certified Nurse Midwife

## 2018-04-01 VITALS — BP 112/64 | HR 80 | Resp 16 | Ht 65.75 in | Wt 216.0 lb

## 2018-04-01 DIAGNOSIS — N938 Other specified abnormal uterine and vaginal bleeding: Secondary | ICD-10-CM

## 2018-04-01 DIAGNOSIS — R350 Frequency of micturition: Secondary | ICD-10-CM

## 2018-04-01 DIAGNOSIS — Z1151 Encounter for screening for human papillomavirus (HPV): Secondary | ICD-10-CM | POA: Diagnosis not present

## 2018-04-01 DIAGNOSIS — Z124 Encounter for screening for malignant neoplasm of cervix: Secondary | ICD-10-CM | POA: Insufficient documentation

## 2018-04-01 DIAGNOSIS — Z0000 Encounter for general adult medical examination without abnormal findings: Secondary | ICD-10-CM

## 2018-04-01 DIAGNOSIS — F17200 Nicotine dependence, unspecified, uncomplicated: Secondary | ICD-10-CM

## 2018-04-01 DIAGNOSIS — Z803 Family history of malignant neoplasm of breast: Secondary | ICD-10-CM | POA: Diagnosis not present

## 2018-04-01 DIAGNOSIS — Z01419 Encounter for gynecological examination (general) (routine) without abnormal findings: Secondary | ICD-10-CM | POA: Diagnosis not present

## 2018-04-01 NOTE — Progress Notes (Signed)
42 y.o. G0P0000 Single  Asian/Caucasian Fe here for annual exam. Periods "awful". Last period of was 3 1/2 weeks of spotting and light bleeding (3-5 days), none heavy.. Had back/legs aching during this last period. Has also had increase appetite with increase in weight as results over the past few months. Had D&C about this time last year which periods, stopped being spotty.. Taking oral iron supplement for greater than 2 months from lab check at dermatology for generalized hair loss. "Feel better energy wise".  Planning to stop smoking in the next few months and has tried all medications and plans to start some type of  program. Patient complaining of her urgency, frequency of urination  has increased over the past year. Denies pain with urination. Has occasional leak with sneeze or hard cough. Has tried to cut back on caffeine aware this can increase urination. Feels like she does not always empty well. Ready to have evaluated. 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 Not sexually active. Sees urgent care if needed. Screening labs if needed.  Patient's last menstrual period was 03/07/2018 (approximate).          Sexually active: No.  The current method of family planning is abstinence.    Exercising: Yes.    walking. Smoker:  Yes, 1/2 ppd  Health Maintenance: Pap:  1995, 10-15-15 neg HPV HR neg History of Abnormal Pap: no MMG:  11-12-15 category category c density birads 1:neg Self Breast exams: no Colonoscopy:  none BMD:   none TDaP:  2016 Shingles: no Pneumonia: no Hep C and HIV: HIV neg 2016 Labs: if needed   reports that she has been smoking cigarettes.  She has a 18.00 pack-year smoking history. She has never used smokeless tobacco. She reports that she does not drink alcohol or use drugs.  Past Medical History:  Diagnosis Date  . Allergy   . Anemia    not recent  . Anxiety   . Arthritis   . Asthma   . GERD (gastroesophageal reflux  disease)    no issues since weigh loss  . Pneumonia 1997  . PONV (postoperative nausea and vomiting)     Past Surgical History:  Procedure Laterality Date  . arm lift  09/2016  . breast lift  09/2016  . BREAST SURGERY    . DILATATION & CURETTAGE/HYSTEROSCOPY WITH MYOSURE N/A 03/16/2017   Procedure: DILATATION & CURETTAGE/HYSTEROSCCOPY;  Surgeon: [name], MD;  Location: [facility];  Service: Gynecology;  Laterality: N/A;  . FINGER SURGERY Right age 21   thumb  . MASTOPEXY Bilateral 09/27/2016   she also had bilateral bronchioplasty at same time.  . WISDOM TOOTH EXTRACTION      Current Outpatient Medications  Medication Sig Dispense Refill  . albuterol (PROVENTIL HFA;VENTOLIN HFA) 108 (90 Base) MCG/ACT inhaler Inhale 1-2 puffs into the lungs every 6 (six) hours as needed for wheezing or shortness of breath.    . Biotin 5 MG TABS Take 5 mg by mouth daily.    . Cholecalciferol (VITAMIN D3) 5000 units CAPS Take 5,000-10,000 Units by mouth See admin instructions. Takes 5000 units on tues thurs saturday and Sunday Takes 10,000 units on Monday wed and fri    . cyanocobalamin 1000 MCG tablet Take 1,000 mcg by mouth 2 (two) times a week.     Marland Kitchen ibuprofen (ADVIL,MOTRIN) 400 MG tablet Take 400 mg by mouth every 6 (six) hours as needed.    Marland Kitchen  MELATONIN PO Take 1 tablet by mouth at bedtime as needed (sleep).    Marland Kitchen VITAMIN E SKIN OIL Apply 1 application topically 2 (two) times daily.     No current facility-administered medications for this visit.     Family History  Problem Relation Age of Onset  . Hyperlipidemia Mother   . Hypertension Mother   . Breast cancer Mother 52       chemo, radiation, mastectomy  . Hypertension Father   . Hyperlipidemia Father   . Hyperlipidemia Sister   . Hypertension Sister   . Heart failure Paternal Grandmother        70's  . Heart failure Paternal Grandfather        87  . Prostate cancer Paternal Grandfather   . Hyperlipidemia Sister   .  Hypertension Sister   . Hypertension Sister   . Hyperlipidemia Sister   . Colon cancer Maternal Grandmother        in her 80's    ROS:  Pertinent items are noted in HPI.  Otherwise, a comprehensive ROS was negative.  Exam:   BP 112/64 (BP Location: Right Arm, Patient Position: Sitting, Cuff Size: Large)   Pulse 80   Resp 16   Ht 5' 5.75" (1.67 m)   Wt 216 lb (98 kg)   LMP 03/07/2018 (Approximate)   BMI 35.13 kg/m  Height: 5' 5.75" (167 cm) Ht Readings from Last 3 Encounters:  04/01/18 5' 5.75" (1.67 m)  03/30/17  (1.676 m)  03/12/17  (1.676 m)    General appearance: alert, cooperative and appears stated age Head: Normocephalic, without obvious abnormality, atraumatic Neck: no adenopathy, supple, symmetrical, trachea midline and thyroid normal to inspection and palpation Lungs: clear to auscultation bilaterally CVAT: bilateral non tender Breasts: normal appearance, no masses or tenderness, No nipple retraction or dimpling, No nipple discharge or bleeding, No axillary or supraclavicular adenopathy Heart: regular rate and rhythm Abdomen: soft, non-tender; no masses,  no organomegaly, suprapubic non tender Extremities: extremities normal, atraumatic, no cyanosis or edema Skin: Skin color, texture, turgor normal. No rashes or lesions Lymph nodes: Cervical, supraclavicular, and axillary nodes normal. No abnormal inguinal nodes palpated Neurologic: Grossly normal   Pelvic: External genitalia:  no lesions, normal female              Urethra:  normal appearing urethra with no masses, tenderness or lesions, Bladder non tender              Bartholin's and Skene's: normal , non tender               Vagina: normal appearing vagina with normal color and discharge, no lesions, no cystocele noted              Cervix: cervical motion tenderness, no cervical motion tenderness and no lesions              Pap taken: Yes.   Bimanual Exam:  Uterus:  normal size, contour, position,  consistency, mobility, non-tender and anteverted              Adnexa: normal adnexa and no mass, fullness, tenderness               Rectovaginal: Confirms               Anus:  normal sphincter tone, no lesions  Chaperone present: yes  A:  Well Woman with normal exam  Contraception none, not sexually active  DUB with normal cycles, spotting,  history of benign endometrial polyp  Urinary frequency,urgency,occasional stress incontinence  Anemia on po OTC iron  Smoker desires to quit  History of obesity with significant weight loss, with 22 pound weight gain  Family history of breast cancer mother, mammogram over due, avoided due to bilateral breast reduction surgery soreness, will schedule this year. Had one prior to surgery    P:   Reviewed health and wellness pertinent to exam  Discussed importance of keeping menses cycle record and reviewed warning signs with bleeding. Patient has started spotting again. Discussed weight influence on cycle change, and concerns with long term spotting can contribute to anemia. Patient to monitor over the next month and advise.   Discussed no cystocele noted with vaginal exam, or physical findings to indicate cause of frequency. Will no urine culture and treat if indicated. Discussed emptying bladder completely each time, stopping any caffeine use. Discussed evaluation in office with urodynamic testing to see if concerns before discussing possible medication use. Patient agreeable.  Continue current iron with Vitamin C source daily and will advise once labs in.  Lab: Ferritin , Iron , TIBC, CBC  Given information on smoking cessation program with [facility]. She plans to work on this soon.  Discussed increase weight gain again , can increase risk of cycle changes, she plans to be back on track soon.  Stressed SBE and scheduling mammogram soon.  Pap smear: yes   counseled on breast self exam, mammography screening, feminine hygiene, adequate intake of  calcium and vitamin D, diet and exercise  return annually or prn  An After Visit Summary was printed and given to the patient.

## 2018-04-01 NOTE — Addendum Note (Signed)
Addended by: Verner Chol on: 04/01/2018 01:25 PM   Modules accepted: Orders

## 2018-04-01 NOTE — Patient Instructions (Addendum)
EXERCISE AND DIET:  We recommended that you start or continue a regular exercise program for good health. Regular exercise means any activity that makes your heart beat faster and makes you sweat.  We recommend exercising at least 30 minutes per day at least 3 days a week, preferably 4 or 5.  We also recommend a diet low in fat and sugar.  Inactivity, poor dietary choices and obesity can cause diabetes, heart attack, stroke, and kidney damage, among others.    ALCOHOL AND SMOKING:  Women should limit their alcohol intake to no more than 7 drinks/beers/glasses of wine (combined, not each!) per week. Moderation of alcohol intake to this level decreases your risk of breast cancer and liver damage. And of course, no recreational drugs are part of a healthy lifestyle.  And absolutely no smoking or even second hand smoke. Most people know smoking can cause heart and lung diseases, but did you know it also contributes to weakening of your bones? Aging of your skin?  Yellowing of your teeth and nails?  CALCIUM AND VITAMIN D:  Adequate intake of calcium and Vitamin D are recommended.  The recommendations for exact amounts of these supplements seem to change often, but generally speaking 600 mg of calcium (either carbonate or citrate) and 800 units of Vitamin D per day seems prudent. Certain women may benefit from higher intake of Vitamin D.  If you are among these women, your doctor will have told you during your visit.    PAP SMEARS:  Pap smears, to check for cervical cancer or precancers,  have traditionally been done yearly, although recent scientific advances have shown that most women can have pap smears less often.  However, every woman still should have a physical exam from her gynecologist every year. It will include a breast check, inspection of the vulva and vagina to check for abnormal growths or skin changes, a visual exam of the cervix, and then an exam to evaluate the size and shape of the uterus and  ovaries.  And after 42 years of age, a rectal exam is indicated to check for rectal cancers. We will also provide age appropriate advice regarding health maintenance, like when you should have certain vaccines, screening for sexually transmitted diseases, bone density testing, colonoscopy, mammograms, etc.   MAMMOGRAMS:  All women over 42 years old should have a yearly mammogram. Many facilities now offer a "3D" mammogram, which may cost around $50 extra out of pocket. If possible,  we recommend you accept the option to have the 3D mammogram performed.  It both reduces the number of women who will be called back for extra views which then turn out to be normal, and it is better than the routine mammogram at detecting truly abnormal areas.    COLONOSCOPY:  Colonoscopy to screen for colon cancer is recommended for all women at age 42.  We know, you hate the idea of the prep.  We agree, BUT, having colon cancer and not knowing it is  Dysfunctional Uterine Bleeding Dysfunctional uterine bleeding is abnormal bleeding from the uterus. Dysfunctional uterine bleeding includes:  A period that comes earlier or later than usual.  A period that is lighter, heavier, or has blood clots.  Bleeding between periods.  Skipping one or more periods.  Bleeding after sexual intercourse.  Bleeding after menopause.  Follow these instructions at home: Pay attention to any changes in your symptoms. Follow these instructions to help with your condition: Eating and drinking  Eat well-balanced  meals. Include foods that are high in iron, such as liver, meat, shellfish, green leafy vegetables, and eggs.  If you become constipated: ? Drink plenty of water. ? Eat fruits and vegetables that are high in water and fiber, such as spinach, carrots, raspberries, apples, and mango. Medicines  Take over-the-counter and prescription medicines only as told by your health care provider.  Do not change medicines without talking  with your health care provider.  Aspirin or medicines that contain aspirin may make the bleeding worse. Do not take those medicines: ? During the week before your period. ? During your period.  If you were prescribed iron pills, take them as told by your health care provider. Iron pills help to replace iron that your body loses because of this condition. Activity  If you need to change your sanitary pad or tampon more than one time every 2 hours: ? Lie in bed with your feet raised (elevated). ? Place a cold pack on your lower abdomen. ? Rest as much as possible until the bleeding stops or slows down.  Do not try to lose weight until the bleeding has stopped and your blood iron level is back to normal. Other Instructions  For two months, write down: ? When your period starts. ? When your period ends. ? When any abnormal bleeding occurs. ? What problems you notice.  Keep all follow up visits as told by your health care provider. This is important. Contact a health care provider if:  You get light-headed or weak.  You have nausea and vomiting.  You cannot eat or drink without vomiting.  You feel dizzy or have diarrhea while you are taking medicines.  You are taking birth control pills or hormones, and you want to change them or stop taking them. Get help right away if:  You develop a fever or chills.  You need to change your sanitary pad or tampon more than one time per hour.  Your bleeding becomes heavier, or your flow contains clots more often.  You develop pain in your abdomen.  You lose consciousness.  You develop a rash. This information is not intended to replace advice given to you by your health care provider. Make sure you discuss any questions you have with your health care provider. Document Released: 11/07/2000 Document Revised: 04/17/2016 Document Reviewed: 02/05/2015 Elsevier Interactive Patient Education  2018 [employer]. worse!!  Colon cancer so  often starts as a polyp that can be seen and removed at colonscopy, which can quite literally save your life!  And if your first colonoscopy is normal and you have no family history of colon cancer, most women don't have to have it again for 10 years.  Once every ten years, you can do something that may end up saving your life, right?  We will be happy to help you get it scheduled when you are ready.  Be sure to check your insurance coverage so you understand how much it will cost.  It may be covered as a preventative service at no cost, but you should check your particular policy.

## 2018-04-02 LAB — COMPREHENSIVE METABOLIC PANEL
A/G RATIO: 1.7 (ref 1.2–2.2)
ALT: 16 IU/L (ref 0–32)
AST: 13 IU/L (ref 0–40)
Albumin: 3.9 g/dL (ref 3.5–5.5)
Alkaline Phosphatase: 53 IU/L (ref 39–117)
BUN/Creatinine Ratio: 26 — ABNORMAL HIGH (ref 9–23)
BUN: 18 mg/dL (ref 6–24)
Bilirubin Total: 0.3 mg/dL (ref 0.0–1.2)
CALCIUM: 8.8 mg/dL (ref 8.7–10.2)
CHLORIDE: 105 mmol/L (ref 96–106)
CO2: 22 mmol/L (ref 20–29)
Creatinine, Ser: 0.7 mg/dL (ref 0.57–1.00)
GFR, EST AFRICAN AMERICAN: 124 mL/min/{1.73_m2} (ref >59)
GFR, EST NON AFRICAN AMERICAN: 108 mL/min/{1.73_m2} (ref >59)
GLOBULIN, TOTAL: 2.3 g/dL (ref 1.5–4.5)
Glucose: 84 mg/dL (ref 65–99)
POTASSIUM: 4.5 mmol/L (ref 3.5–5.2)
SODIUM: 140 mmol/L (ref 134–144)
TOTAL PROTEIN: 6.2 g/dL (ref 6.0–8.5)

## 2018-04-02 LAB — VITAMIN D 25 HYDROXY (VIT D DEFICIENCY, FRACTURES): VIT D 25 HYDROXY: 28.2 ng/mL — AB (ref 30.0–100.0)

## 2018-04-02 LAB — CBC
HEMATOCRIT: 40.7 % (ref 34.0–46.6)
HEMOGLOBIN: 12.9 g/dL (ref 11.1–15.9)
MCH: 29.6 pg (ref 26.6–33.0)
MCHC: 31.7 g/dL (ref 31.5–35.7)
MCV: 93 fL (ref 79–97)
Platelets: 380 10*3/uL — ABNORMAL HIGH (ref 150–379)
RBC: 4.36 x10E6/uL (ref 3.77–5.28)
RDW: 14.6 % (ref 12.3–15.4)
WBC: 9.8 10*3/uL (ref 3.4–10.8)

## 2018-04-02 LAB — LIPID PANEL
Chol/HDL Ratio: 2.5 ratio (ref 0.0–4.4)
Cholesterol, Total: 197 mg/dL (ref 100–199)
HDL: 79 mg/dL (ref >39)
LDL CALC: 110 mg/dL — AB (ref 0–99)
Triglycerides: 42 mg/dL (ref 0–149)
VLDL CHOLESTEROL CAL: 8 mg/dL (ref 5–40)

## 2018-04-02 LAB — IRON AND TIBC
IRON SATURATION: 17 % (ref 15–55)
IRON: 57 ug/dL (ref 27–159)
TIBC: 344 ug/dL (ref 250–450)
UIBC: 287 ug/dL (ref 131–425)

## 2018-04-02 LAB — URINE CULTURE

## 2018-04-02 LAB — FERRITIN: Ferritin: 22 ng/mL (ref 15–150)

## 2018-04-02 LAB — TSH: TSH: 0.685 u[IU]/mL (ref 0.450–4.500)

## 2018-04-05 LAB — CYTOLOGY - PAP
Bacterial vaginitis: POSITIVE — AB
CANDIDA VAGINITIS: NEGATIVE
Diagnosis: NEGATIVE
HPV (WINDOPATH): NOT DETECTED

## 2018-04-06 ENCOUNTER — Telehealth: Payer: Self-pay

## 2018-04-06 MED ORDER — METRONIDAZOLE 500 MG PO TABS: 500.0000 mg | ORAL_TABLET | Freq: Two times a day (BID) | ORAL | 0 refills | Status: DC

## 2018-04-06 NOTE — Telephone Encounter (Signed)
Spoke with patient. Advised of results and message as seen below from [employer] CNM. Patient verbalizes understanding. Rx for Flagyl 500 mg po BID x 7 days #14 0RF sent to pharmacy on file. Appointment scheduled with Dr.Silva on 6/5at 10:30 am. Asking if she needs the 2 week recheck with Leota Sauers CNM as well.  Verner Chol, CNM on 04/06/2018 at 8:11 AM EDT Notify patient that her vaginal screen was negative for yeast, but positive for BV. Will need Rx Flagyl 500 mg bid 7 Pap smear negative, HPV not detected 02 ------  Notes recorded by Verner Chol, CNM on 04/04/2018 at 7:16 PM EDT Notify patient that urine culture is negative and we can go ahead and schedule her evaluation with Dr. Edward Jolly regarding leaking and incomplete emptying CBC shows no anemia and is essentially normal Iron and Iron saturation Is normal Ferritin is normal Lipid panel overall good profile,Cholesterol 197 normal< 199 HDL 79 normal >39 LDL borderline elevation at 110 normal <99 Continue good diet and exercise TSH is normal Vitamin D borderline low at 28.2, start on OTC Vitamin D 3 1000 IU daily and maintain with this Liver, kidney and glucose profile essentially normal

## 2018-04-06 NOTE — Telephone Encounter (Signed)
Left message to call Kaitlyn at [phone]. 

## 2018-04-06 NOTE — Telephone Encounter (Signed)
Only if she feels symptoms have not resolved or questions

## 2018-04-08 NOTE — Telephone Encounter (Signed)
Left message to call Jannetta Massey at [phone]. 

## 2018-04-09 NOTE — Telephone Encounter (Signed)
Patient returned call. She states she would like to cancel appointment on 04/15/18 with Leota Sauers as she has appointment with Dr. Edward Jolly on 04/28/18. Patient's appointment for 04/15/18 has been canceled and patient will call back if she feels she needs to be seen sooner than 04/28/18.   Cc: Kaitlyn Sprague RN and Brook A. Edward Jolly, MD   Routing to provider for final review. Patient agreeable to disposition. Will close encounter.

## 2018-04-14 ENCOUNTER — Ambulatory Visit: Payer: 59 | Admitting: Certified Nurse Midwife

## 2018-04-15 ENCOUNTER — Ambulatory Visit: Payer: 59 | Admitting: Certified Nurse Midwife

## 2018-04-28 ENCOUNTER — Encounter: Payer: Self-pay | Admitting: Obstetrics and Gynecology

## 2018-04-28 ENCOUNTER — Other Ambulatory Visit: Payer: Self-pay

## 2018-04-28 ENCOUNTER — Ambulatory Visit (INDEPENDENT_AMBULATORY_CARE_PROVIDER_SITE_OTHER): Payer: 59 | Admitting: Obstetrics and Gynecology

## 2018-04-28 VITALS — BP 118/62 | HR 68 | Resp 16 | Ht 66.0 in | Wt 214.0 lb

## 2018-04-28 DIAGNOSIS — R350 Frequency of micturition: Secondary | ICD-10-CM

## 2018-04-28 DIAGNOSIS — N3946 Mixed incontinence: Secondary | ICD-10-CM

## 2018-04-28 LAB — POCT URINALYSIS DIPSTICK
BILIRUBIN UA: NEGATIVE
GLUCOSE UA: NEGATIVE
Ketones, UA: NEGATIVE
Nitrite, UA: NEGATIVE
Protein, UA: NEGATIVE
Urobilinogen, UA: 0.2 E.U./dL
pH, UA: 6 (ref 5.0–8.0)

## 2018-04-28 NOTE — Patient Instructions (Signed)
Urinary Incontinence Urinary incontinence is the involuntary loss of urine from your bladder. What are the causes? There are many causes of urinary incontinence. They include:  Medicines.  Infections.  Prostatic enlargement, leading to overflow of urine from your bladder.  Surgery.  Neurological diseases.  Emotional factors.  What are the signs or symptoms? Urinary Incontinence can be divided into four types: 1. Urge incontinence. Urge incontinence is the involuntary loss of urine before you have the opportunity to go to the bathroom. There is a sudden urge to void but not enough time to reach a bathroom. 2. Stress incontinence. Stress incontinence is the sudden loss of urine with any activity that forces urine to pass. It is commonly caused by anatomical changes to the pelvis and sphincter areas of your body. 3. Overflow incontinence. Overflow incontinence is the loss of urine from an obstructed opening to your bladder. This results in a backup of urine and a resultant buildup of pressure within the bladder. When the pressure within the bladder exceeds the closing pressure of the sphincter, the urine overflows, which causes incontinence, similar to water overflowing a dam. 4. Total incontinence. Total incontinence is the loss of urine as a result of the inability to store urine within your bladder.  How is this diagnosed? Evaluating the cause of incontinence may require:  A thorough and complete medical and obstetric history.  A complete physical exam.  Laboratory tests such as a urine culture and sensitivities.  When additional tests are indicated, they can include:  An ultrasound exam.  Kidney and bladder X-rays.  Cystoscopy. This is an exam of the bladder using a narrow scope.  Urodynamic testing to test the nerve function to the bladder and sphincter areas.  How is this treated? Treatment for urinary incontinence depends on the cause:  For urge incontinence caused  by a bacterial infection, antibiotics will be prescribed. If the urge incontinence is related to medicines you take, your health care provider may have you change the medicine.  For stress incontinence, surgery to re-establish anatomical support to the bladder or sphincter, or both, will often correct the condition.  For overflow incontinence caused by an enlarged prostate, an operation to open the channel through the enlarged prostate will allow the flow of urine out of the bladder. In women with fibroids, a hysterectomy may be recommended.  For total incontinence, surgery on your urinary sphincter may help. An artificial urinary sphincter (an inflatable cuff placed around the urethra) may be required. In women who have developed a hole-like passage between their bladder and vagina (vesicovaginal fistula), surgery to close the fistula often is required.  Follow these instructions at home:  Normal daily hygiene and the use of pads or adult diapers that are changed regularly will help prevent odors and skin damage.  Avoid caffeine. It can overstimulate your bladder.  Use the bathroom regularly. Try about every 2-3 hours to go to the bathroom, even if you do not feel the need to do so. Take time to empty your bladder completely. After urinating, wait a minute. Then try to urinate again.  For causes involving nerve dysfunction, keep a log of the medicines you take and a journal of the times you go to the bathroom. Contact a health care provider if:  You experience worsening of pain instead of improvement in pain after your procedure.  Your incontinence becomes worse instead of better. Get help right away if:  You experience fever or shaking chills.  You are unable to   pass your urine.  You have redness spreading into your groin or down into your thighs. This information is not intended to replace advice given to you by your health care provider. Make sure you discuss any questions you have  with your health care provider. Document Released: 12/18/2004 Document Revised: 06/20/2016 Document Reviewed: 04/19/2013 Elsevier Interactive Patient Education  2018 [employer]   Overactive Bladder, Adult Overactive bladder is a group of urinary symptoms. With overactive bladder, you may suddenly feel the need to pass urine (urinate) right away. After feeling this sudden urge, you might also leak urine if you cannot get to the bathroom fast enough (urinary incontinence). These symptoms might interfere with your daily work or social activities. Overactive bladder symptoms may also wake you up at night. Overactive bladder affects the nerve signals between your bladder and your brain. Your bladder may get the signal to empty before it is full. Very sensitive muscles can also make your bladder squeeze too soon. What are the causes? Many things can cause an overactive bladder. Possible causes include: Urinary tract infection. Infection of nearby tissues, such as the prostate. Prostate enlargement. Being pregnant with twins or more (multiples). Surgery on the uterus or urethra. Bladder stones, inflammation, or tumors. Drinking too much caffeine or alcohol. Certain medicines, especially those that you take to help your body get rid of extra fluid (diuretics) by increasing urine production. Muscle or nerve weakness, especially from: A spinal cord injury. Stroke. Multiple sclerosis. Parkinson disease. Diabetes. This can cause a high urine volume that fills the bladder so quickly that the normal urge to urinate is triggered very strongly. Constipation. A buildup of too much stool can put pressure on your bladder.  What increases the risk? You may be at greater risk for overactive bladder if you: Are an older adult. Smoke. Are going through menopause. Have prostate problems. Have a neurological disease, such as stroke, dementia, Parkinson disease, or multiple sclerosis (MS). Eat or drink  things that irritate the bladder. These include alcohol, spicy food, and caffeine. Are overweight or obese.  What are the signs or symptoms? The signs and symptoms of an overactive bladder include: Sudden, strong urges to urinate. Leaking urine. Urinating eight or more times per day. Waking up to urinate two or more times per night.  How is this diagnosed? Your health care provider may suspect overactive bladder based on your symptoms. The health care provider will do a physical exam and take your medical history. Blood or urine tests may also be done. For example, you might need to have a bladder function test to check how well you can hold your urine. You might also need to see a health care provider who specializes in the urinary tract (urologist). How is this treated? Treatment for overactive bladder depends on the cause of your condition and whether it is mild or severe. Certain treatments can be done in your health care provider's office or clinic. You can also make lifestyle changes at home. Options include: Behavioral Treatments Biofeedback. A specialist uses sensors to help you become aware of your body's signals. Keeping a daily log of when you need to urinate and what happens after the urge. This may help you manage your condition. Bladder training. This helps you learn to control the urge to urinate by following a schedule that directs you to urinate at regular intervals (timed voiding). At first, you might have to wait a few minutes after feeling the urge. In time, you should be able  to schedule bathroom visits an hour or more apart. Kegel exercises. These are exercises to strengthen the pelvic floor muscles, which support the bladder. Toning these muscles can help you control urination, even if your bladder muscles are overactive. A specialist will teach you how to do these exercises correctly. They require daily practice. Weight loss. If you are obese or overweight, losing weight  might relieve your symptoms of overactive bladder. Talk to your health care provider about losing weight and whether there is a specific program or method that would work best for you. Diet change. This might help if constipation is making your overactive bladder worse. Your health care provider or a dietitian can explain ways to change what you eat to ease constipation. You might also need to consume less alcohol and caffeine or drink other fluids at different times of the day. Stopping smoking. Wearing pads to absorb leakage while you wait for other treatments to take effect. Physical Treatments Electrical stimulation. Electrodes send gentle pulses of electricity to strengthen the nerves or muscles that help to control the bladder. Sometimes, the electrodes are placed outside of the body. In other cases, they might be placed inside the body (implanted). This treatment can take several months to have an effect. Supportive devices. Women may need a plastic device that fits into the vagina and supports the bladder (pessary). Medicines Several medicines can help treat overactive bladder and are usually used along with other treatments. Some are injected into the muscles involved in urination. Others come in pill form. Your health care provider may prescribe: Antispasmodics. These medicines block the signals that the nerves send to the bladder. This keeps the bladder from releasing urine at the wrong time. Tricyclic antidepressants. These types of antidepressants also relax bladder muscles.  Surgery You may have a device implanted to help manage the nerve signals that indicate when you need to urinate. You may have surgery to implant electrodes for electrical stimulation. Sometimes, very severe cases of overactive bladder require surgery to change the shape of the bladder. Follow these instructions at home: Take medicines only as directed by your health care provider. Use any implants or a pessary as  directed by your health care provider. Make any diet or lifestyle changes that are recommended by your health care provider. These might include: Drinking less fluid or drinking at different times of the day. If you need to urinate often during the night, you may need to stop drinking fluids early in the evening. Cutting down on caffeine or alcohol. Both can make an overactive bladder worse. Caffeine is found in coffee, tea, and sodas. Doing Kegel exercises to strengthen muscles. Losing weight if you need to. Eating a healthy and balanced diet to prevent constipation. Keep a journal or log to track how much and when you drink and also when you feel the need to urinate. This will help your health care provider to monitor your condition. Contact a health care provider if: Your symptoms do not get better after treatment. Your pain and discomfort are getting worse. You have more frequent urges to urinate. You have a fever. Get help right away if: You are not able to control your bladder at all. This information is not intended to replace advice given to you by your health care provider. Make sure you discuss any questions you have with your health care provider. Document Released: 09/06/2009 Document Revised: 04/17/2016 Document Reviewed: 04/05/2014 Elsevier Interactive Patient Education  [employer].

## 2018-04-28 NOTE — Progress Notes (Signed)
GYNECOLOGY  VISIT   HPI: 42 y.o.   Single  Asian/Caucasian  female   [id number] with Patient's last menstrual period was 04/14/2018.   here for urinary symptoms.  This is impacting her work.   Leaks urine with cough, sneeze, or laugh more than half the time.  Feels like this is getting more important for her.  Now she is at times emptying her bladder every 30 - 45 minutes.  Caffeine makes it worse.  Only drinks one cup if she does at all.  Night time frequency can vary - none to 3 times per night.  Has urgency related leakage.  Cannot get to bathroom on time if she sees the toilet.  Urge can really overtake her.  Feels like she is double voiding.   No dysuria or blood in the urine.  No hx renal stones.  Had pyelonephritis in her 62s.   Trying to quit smoking.  Stress impacts her ability to stop smoking.  Took a benzodiazapine in the past to help her stop.  Does not want to take antianxiety medication daily.   Urine dip - trace WBC and trace RBC.   GYNECOLOGIC HISTORY: Patient's last menstrual period was 04/14/2018. Contraception:  abstinence Menopausal hormone therapy:  none Last mammogram:  11/12/15 BIRADS 1 negative/density c Last pap smear:   04/01/18 Pap and HR HPV negative        OB History    Gravida  0   Para  0   Term  0   Preterm  0   AB  0   Living  0     SAB  0   TAB  0   Ectopic  0   Multiple  0   Live Births  0              Patient Active Problem List   Diagnosis Date Noted  . 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 Allergy   . Anemia    not recent  . Anxiety   . Arthritis   . Asthma   . GERD (gastroesophageal reflux disease)    no issues since weigh loss  . Pneumonia 1997  . PONV (postoperative nausea and vomiting)     Past Surgical History:  Procedure Laterality Date  . arm lift  09/2016  . breast lift  09/2016  . BREAST SURGERY    . DILATATION & CURETTAGE/HYSTEROSCOPY WITH MYOSURE N/A  03/16/2017   Procedure: DILATATION & CURETTAGE/HYSTEROSCCOPY;  Surgeon: [name], MD;  Location: [facility];  Service: Gynecology;  Laterality: N/A;  . FINGER SURGERY Right age 38   thumb  . MASTOPEXY Bilateral 09/27/2016   she also had bilateral bronchioplasty at same time.  . WISDOM TOOTH EXTRACTION      Current Outpatient Medications  Medication Sig Dispense Refill  . albuterol (PROVENTIL HFA;VENTOLIN HFA) 108 (90 Base) MCG/ACT inhaler Inhale 1-2 puffs into the lungs every 6 (six) hours as needed for wheezing or shortness of breath.    . Biotin 5 MG TABS Take 5 mg by mouth daily.    . Cholecalciferol (VITAMIN D3) 5000 units CAPS Take 5,000-10,000 Units by mouth See admin instructions. Takes 5000 units on tues thurs saturday and Sunday Takes 10,000 units on Monday wed and fri    . VITAMIN E SKIN OIL Apply 1 application topically 2 (two) times daily.    . cyanocobalamin 1000 MCG tablet Take 1,000 mcg by mouth 2 (  two) times a week.      No current facility-administered medications for this visit.      ALLERGIES: Chantix [varenicline]; Zyban [bupropion]; and Lamisil [terbinafine]  Family History  Problem Relation Age of Onset  . Hyperlipidemia Mother   . Hypertension Mother   . Breast cancer Mother [id number]       chemo, radiation, mastectomy  . Hypertension Father   . Hyperlipidemia Father   . Hyperlipidemia Sister   . Hypertension Sister   . Heart failure Paternal Grandmother        70's  . Heart failure Paternal Grandfather        [id number]  . Prostate cancer Paternal Grandfather   . Hyperlipidemia Sister   . Hypertension Sister   . Hypertension Sister   . Hyperlipidemia Sister   . Colon cancer Maternal Grandmother        in her 5280's    Social History   Socioeconomic History  . Marital status: Single    Spouse name: Not on file  . Number of children: Not on file  . Years of education: Not on file  . Highest education level: Not on file  Occupational History  .  Not on file  Social Needs  . Financial resource strain: Not on file  . Food insecurity:    Worry: Not on file    Inability: Not on file  . Transportation needs:    Medical: Not on file    Non-medical: Not on file  Tobacco Use  . Smoking status: Current Every Day Smoker    Packs/day: 0.75    Years: 24.00    Pack years: 18.00    Types: Cigarettes  . Smokeless tobacco: Never Used  Substance and Sexual Activity  . Alcohol use: No    Alcohol/week: 0.0 oz    Comment: occasional  . Drug use: No  . Sexual activity: Not Currently    Partners: Female    Birth control/protection: None  Lifestyle  . Physical activity:    Days per week: Not on file    Minutes per session: Not on file  . Stress: Not on file  Relationships  . Social connections:    Talks on phone: Not on file    Gets together: Not on file    Attends religious service: Not on file    Active member of club or organization: Not on file    Attends meetings of clubs or organizations: Not on file    Relationship status: Not on file  . Intimate partner violence:    Fear of current or ex partner: Not on file    Emotionally abused: Not on file    Physically abused: Not on file    Forced sexual activity: Not on file  Other Topics Concern  . Not on file  Social History Narrative  . Not on file    Review of Systems  Constitutional: Negative.   HENT: Negative.   Eyes: Negative.   Respiratory: Negative.   Cardiovascular: Negative.   Gastrointestinal: Negative.   Endocrine: Negative.   Genitourinary: Positive for frequency and urgency.       Loss of urine with sneeze or cough  Musculoskeletal: Negative.   Skin: Negative.   Allergic/Immunologic: Negative.   Neurological: Negative.   Hematological: Negative.   Psychiatric/Behavioral: Negative.     PHYSICAL EXAMINATION:    BP 118/62 (BP Location: Right Arm, Patient Position: Sitting, Cuff Size: Normal)   Pulse 68   Resp 16   Ht  5\' 6"  (1.676 m)   Wt 214 lb (97.1  kg)   LMP 04/14/2018   BMI 34.54 kg/m     General appearance: alert, cooperative and appears stated age  Pelvic: External genitalia:  no lesions              Urethra:  normal appearing urethra with no masses, tenderness or lesions              Bartholins and Skenes: normal                 Vagina: normal appearing vagina with normal color and discharge, no lesions              Cervix: no lesions                Bimanual Exam:  Uterus:  normal size, contour, position, consistency, mobility, non-tender              Adnexa: no mass, fullness, tenderness                 Chaperone was present for exam.  ASSESSMENT  Mixed incontinence.  Tobacco use.  PLAN  Comprehensive discussion regarding urinary incontinence.  Will check urine micro and culture.  PT, anticholinergic/antimuscarinic meds discussed, pessary, and surgery briefly mentioned.  She would like to do PT only at this time.  I will refer her to Ruben Gottron.  Literature on urinary incontinence also to patient.  No urodynamic testing at this time.  FU with PCP regarding smoking cessation.   An After Visit Summary was printed and given to the patient.  _15_____ minutes face to face time of which over 50% was spent in counseling.

## 2018-04-29 LAB — URINALYSIS, MICROSCOPIC ONLY: CASTS: NONE SEEN /LPF

## 2018-04-30 LAB — URINE CULTURE

## 2018-05-22 IMAGING — CR DG ABDOMEN ACUTE W/ 1V CHEST
3 series · 3 of 3 positions shown · non-contrast
Comparison: None.

CLINICAL DATA: Constipation with cramping and nausea

EXAM:
DG ABDOMEN ACUTE W/ 1V CHEST

[w chest pa]
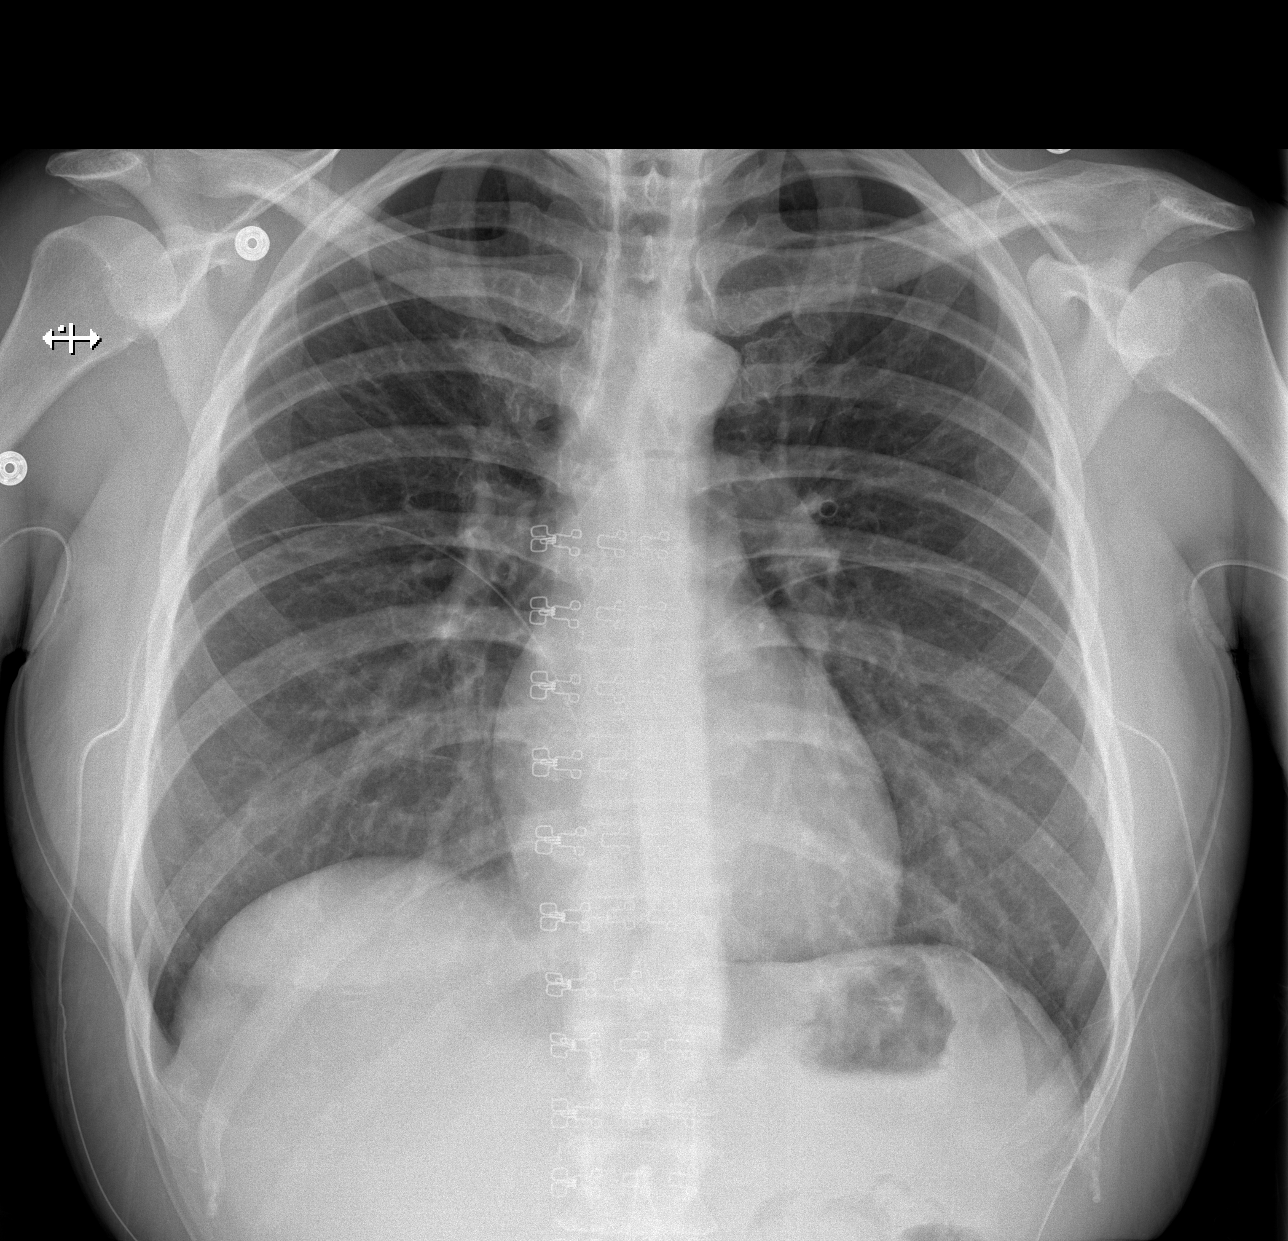

[w abdomen upright]
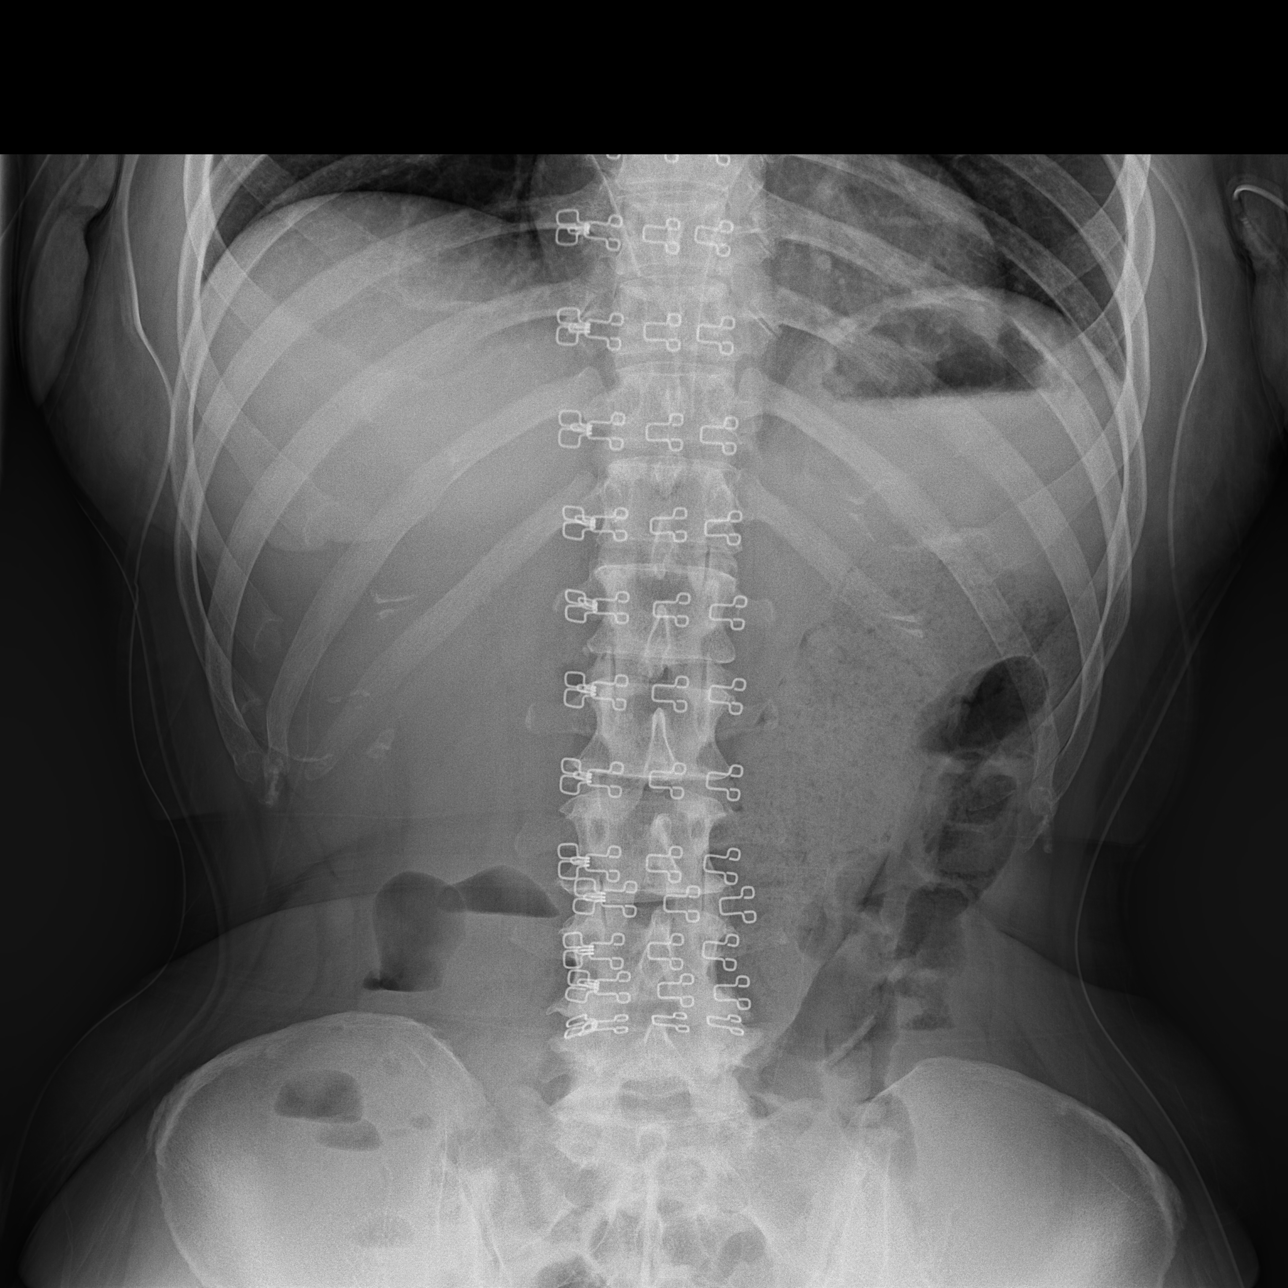

[t abdomen supine]
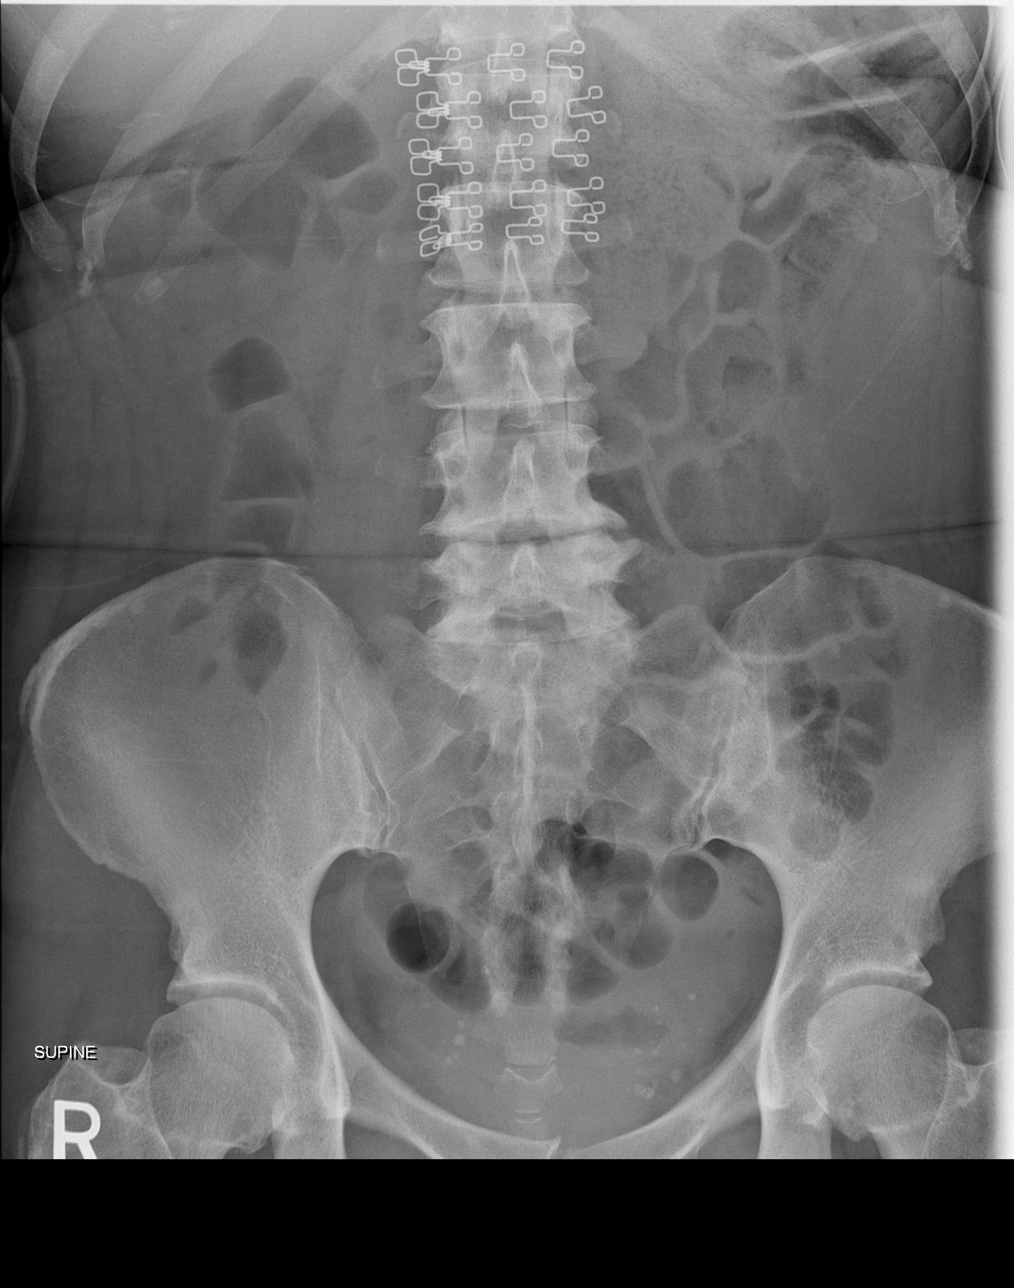

[3 of 3 positions shown; findings below may reference images not displayed]

FINDINGS: PA chest: No edema or consolidation. Heart size and pulmonary
vascularity are normal. No adenopathy.

Supine and upright abdomen: There is moderate stool throughout
colon. There is no bowel dilatation. There are scattered air-fluid
levels in the abdomen and upper pelvis. No free air. There are
phleboliths in the pelvis.
IMPRESSION: Scattered air-fluid levels without bowel dilatation. Suspect early
ileus or enteritis. Moderate stool in colon. No free air. No lung
edema or consolidation.

## 2018-05-22 IMAGING — CT CT ABD-PELV W/ CM
2 of 4 series · 15 of 46 positions shown, 17 images · IV contrast (iopamidol)
Comparison: Acute abdominal radiographic series 5388 hours today.

CLINICAL DATA: 40-year-old female with severe constipation. Recent
narcotics following flank / axillary area surgery to remove excess
skin. Initial encounter.

EXAM:
CT ABDOMEN AND PELVIS WITH CONTRAST
TECHNIQUE: Multidetector CT imaging of the abdomen and pelvis was performed
using the standard protocol following bolus administration of
intravenous contrast.
CONTRAST:  100mL 81HC1C-8FF IOPAMIDOL (81HC1C-8FF) INJECTION 61%

[Series 2: abd/pel with · axial · 0.76mm/px · z∈[-430,-35]mm · 12 of 89 slices shown, 14 images]
[im 5/89  soft-tissue]
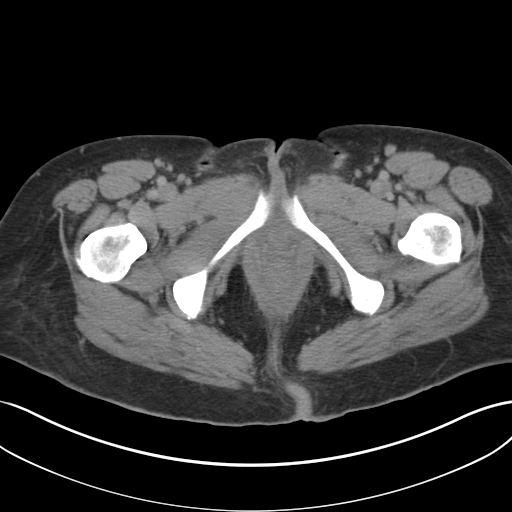
[im 5/89  bone]
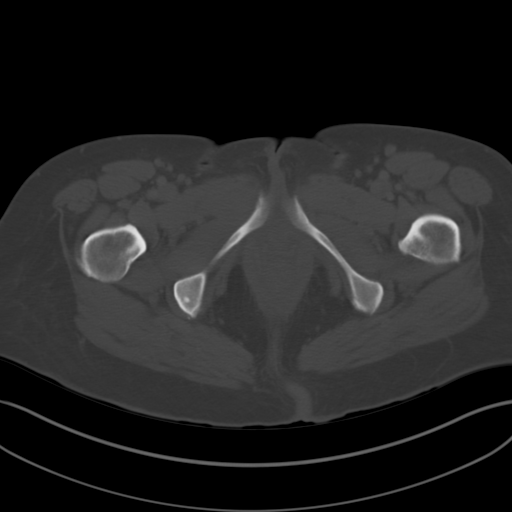
[im 14/89  soft-tissue]
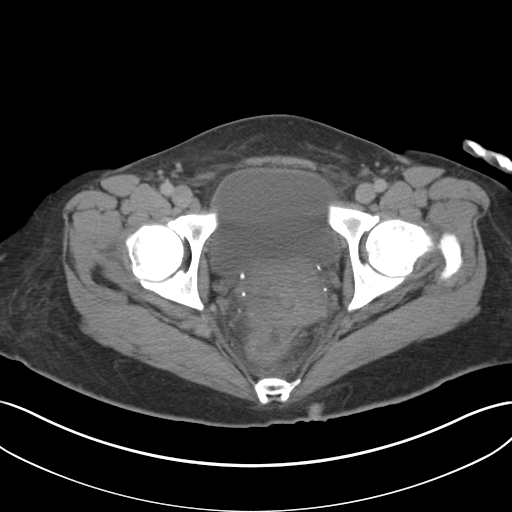
[im 19/89  soft-tissue]
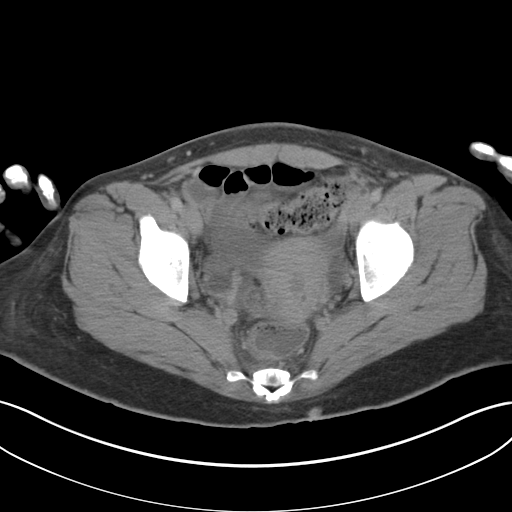
[im 28/89  soft-tissue]
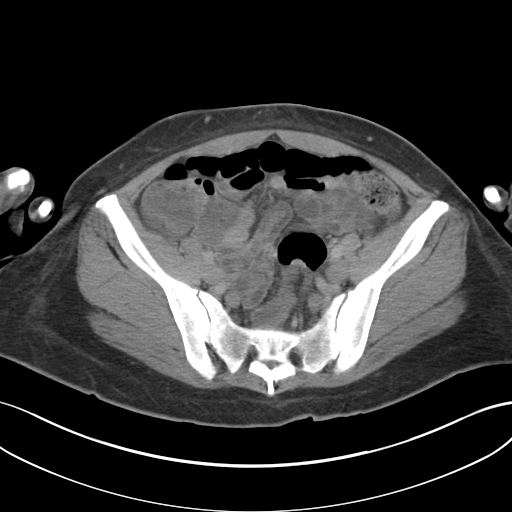
[im 33/89  soft-tissue]
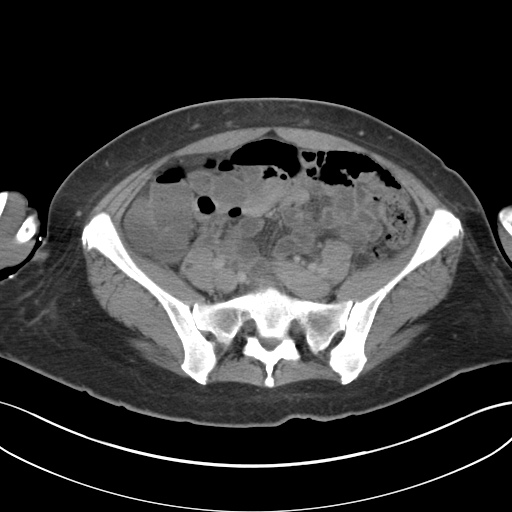
[im 42/89  soft-tissue]
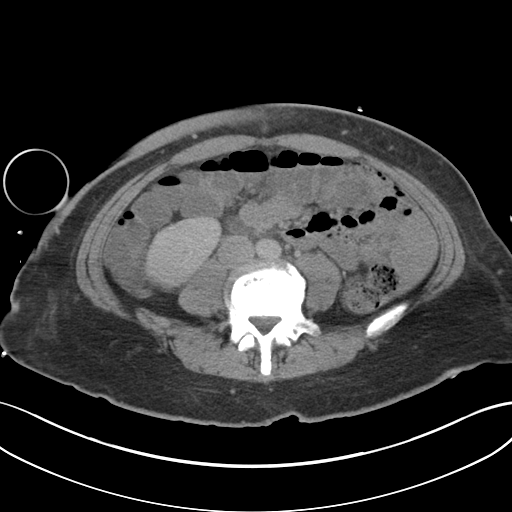
[im 47/89  soft-tissue]
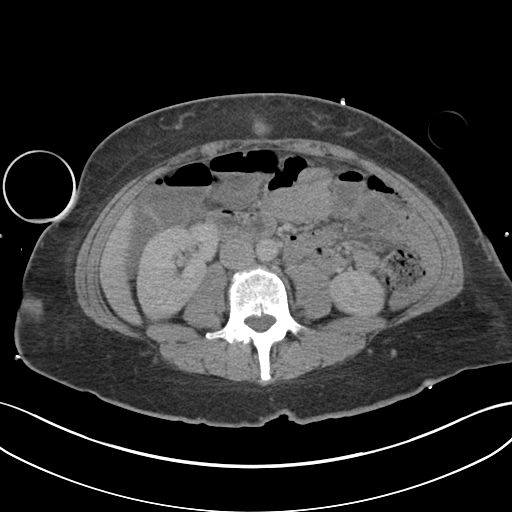
[im 56/89  soft-tissue]
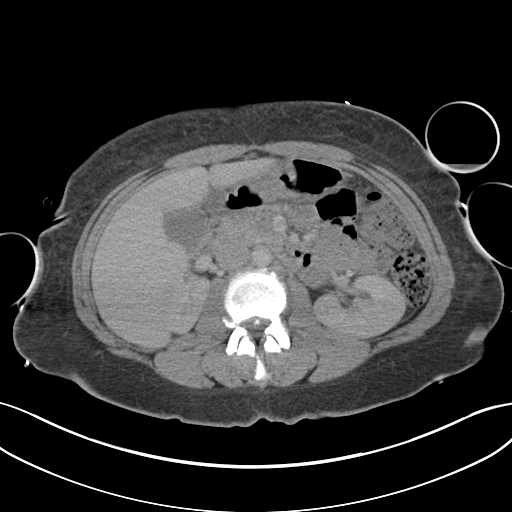
[im 61/89  soft-tissue]
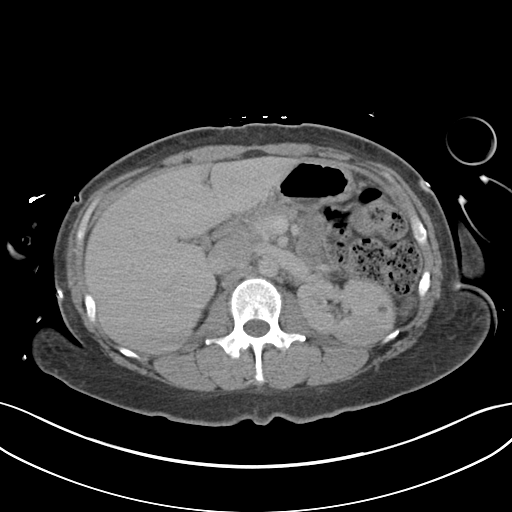
[im 61/89  bone]
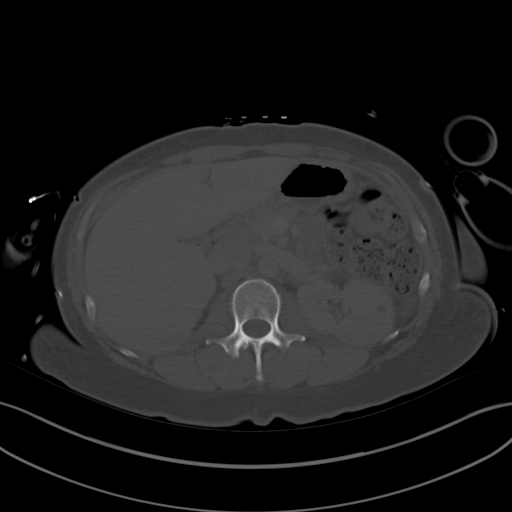
[im 70/89  soft-tissue]
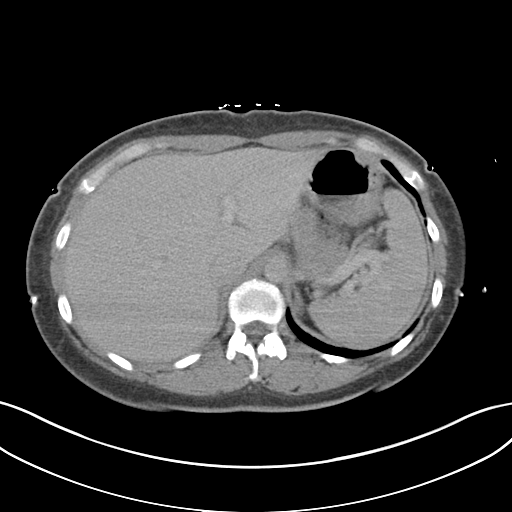
[im 75/89  soft-tissue]
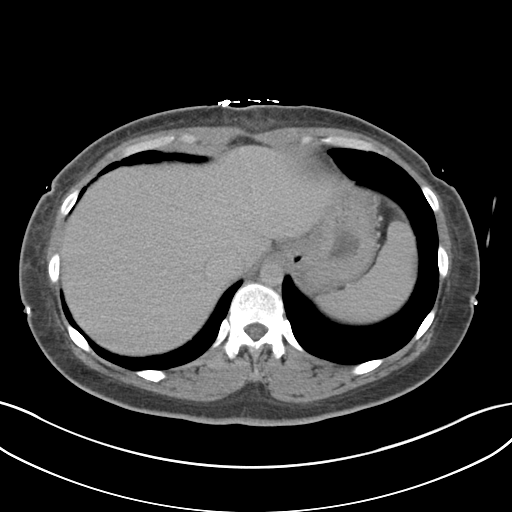
[im 84/89  soft-tissue]
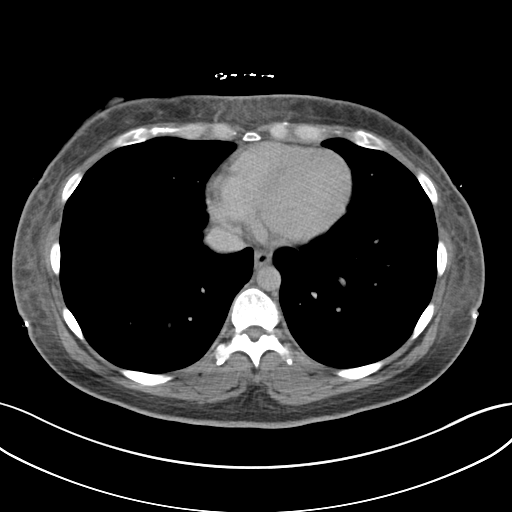

[Series 3: coronal a/|p · coronal · 0.74mm/px · 3 of 142 slices shown]
[im 48/142  soft-tissue]
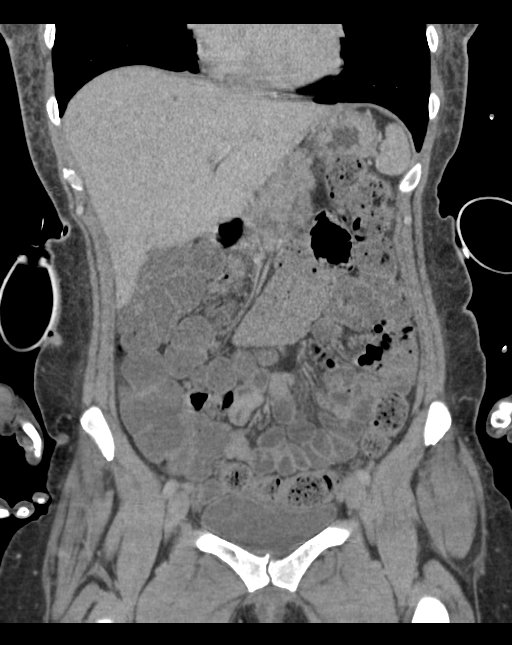
[im 63/142  soft-tissue]
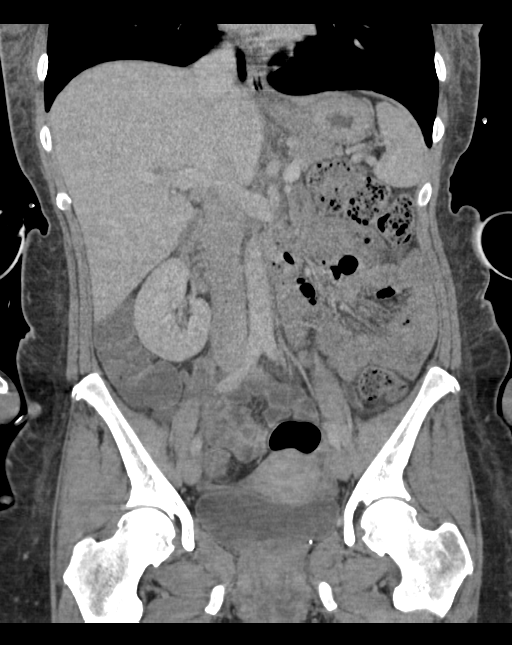
[im 79/142  soft-tissue]
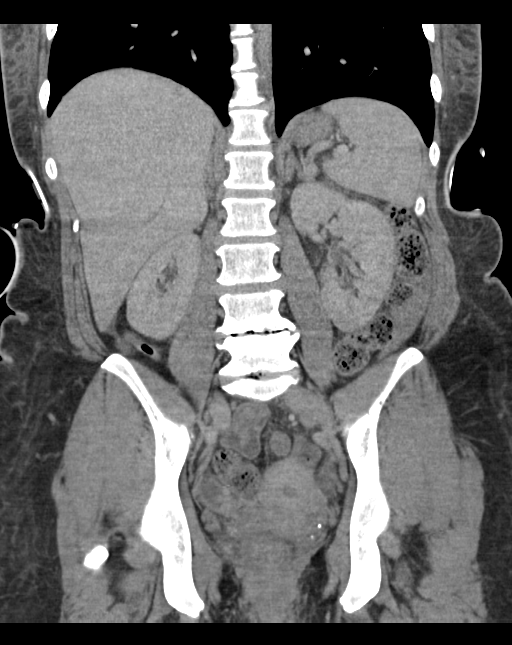

[15 of 46 positions shown; findings below may reference images not displayed]

FINDINGS: Lower chest: Negative.  No pericardial or pleural effusion.

No upper abdominal free air.

Hepatobiliary: Negative. No definite right upper quadrant free
fluid.

Pancreas: Negative.

Spleen: Negative.

Adrenals/Urinary Tract: Negative adrenal glands. Bilateral renal
enhancement is within normal limits ; there is a small benign
appearing cortical cyst at the left midpole and there is mild
rotation of the right kidney (normal variant).

Stomach/Bowel: Circumferential rectal wall thickening on series 2,
image 73 up to 9-10 mm. This continues toward the inguinal verge
(image 77). Fluid in the rectum at this level. Redundant sigmoid
colon with fluid distally and retained stool proximally. No definite
sigmoid wall thickening.

Retained stool throughout the descending colon. Redundant splenic
flexure with retained stool but then there is fluid throughout the
transverse and right colon. Negative retrocecal appendix containing
gas (series 2, image 49). Fluid in nondilated small bowel loops.
Fairly decompressed stomach and duodenum.

Vascular/Lymphatic: Suboptimal intravascular contrast bolus. Major
arterial structures in the abdomen and pelvis appear patent. Portal
venous system appears grossly patent.

No lymphadenopathy identified.

Reproductive: Negative.

Other: Small volume pelvic free fluid.

Mildly asymmetric soft tissue stranding about the bilateral lateral
chest walls and right flank. No subcutaneous gas identified.
Partially visible bilateral lateral chest wall postoperative drains
(series 2, image 1).

Musculoskeletal: Age advanced lower lumbar disc and endplate
degeneration. No acute osseous abnormality identified.
IMPRESSION: 1. Circumferential wall thickening of the rectum suggesting Acute
Proctitis. Fluid throughout the proximal and distal colon with
intervening retained stool from the splenic flexure to the sigmoid.
The fluid could be related to diarrhea or less likely enema use.
2. Small volume pelvic free fluid is nonspecific. No free air. No
dilated small bowel to suggest mechanical bowel obstruction. Normal
appendix.
3. Postoperative bilateral lateral chest wall and right flank soft
tissue stranding. There are small partially visible right lateral
chest wall postoperative drains. No visible subcutaneous gas.

## 2020-02-10 ENCOUNTER — Encounter: Payer: Self-pay | Admitting: Certified Nurse Midwife

## 2024-03-19 DIAGNOSIS — M1990 Unspecified osteoarthritis, unspecified site: Secondary | ICD-10-CM | POA: Diagnosis not present

## 2024-05-12 DIAGNOSIS — F3341 Major depressive disorder, recurrent, in partial remission: Secondary | ICD-10-CM | POA: Diagnosis not present

## 2024-05-12 DIAGNOSIS — R059 Cough, unspecified: Secondary | ICD-10-CM | POA: Diagnosis not present

## 2024-09-29 DIAGNOSIS — E782 Mixed hyperlipidemia: Secondary | ICD-10-CM | POA: Diagnosis not present

## 2024-09-29 DIAGNOSIS — M545 Low back pain, unspecified: Secondary | ICD-10-CM | POA: Diagnosis not present

## 2024-09-29 DIAGNOSIS — F3341 Major depressive disorder, recurrent, in partial remission: Secondary | ICD-10-CM | POA: Diagnosis not present

## 2024-09-29 DIAGNOSIS — Z0000 Encounter for general adult medical examination without abnormal findings: Secondary | ICD-10-CM | POA: Diagnosis not present

## 2024-10-24 DIAGNOSIS — J029 Acute pharyngitis, unspecified: Secondary | ICD-10-CM | POA: Diagnosis not present

## 2024-11-10 DIAGNOSIS — R112 Nausea with vomiting, unspecified: Secondary | ICD-10-CM | POA: Diagnosis not present

## 2024-11-10 DIAGNOSIS — R509 Fever, unspecified: Secondary | ICD-10-CM | POA: Diagnosis not present

## 2024-11-10 DIAGNOSIS — R051 Acute cough: Secondary | ICD-10-CM | POA: Diagnosis not present

## 2024-11-10 DIAGNOSIS — J101 Influenza due to other identified influenza virus with other respiratory manifestations: Secondary | ICD-10-CM | POA: Diagnosis not present
